# Patient Record
Sex: Female | Born: 1955
Health system: Southern US, Community
[De-identification: ages and names within clinical notes are randomized; demographics above are authoritative.]

## PROBLEM LIST (undated history)

## (undated) DIAGNOSIS — J189 Pneumonia, unspecified organism: Secondary | ICD-10-CM

## (undated) DIAGNOSIS — M797 Fibromyalgia: Secondary | ICD-10-CM

## (undated) DIAGNOSIS — T402X5A Adverse effect of other opioids, initial encounter: Secondary | ICD-10-CM

## (undated) DIAGNOSIS — K5903 Drug induced constipation: Secondary | ICD-10-CM

## (undated) DIAGNOSIS — J45909 Unspecified asthma, uncomplicated: Secondary | ICD-10-CM

## (undated) DIAGNOSIS — M1990 Unspecified osteoarthritis, unspecified site: Secondary | ICD-10-CM

## (undated) HISTORY — PX: BACK SURGERY: SHX140

## (undated) HISTORY — PX: CARPAL TUNNEL RELEASE: SHX101

## (undated) HISTORY — PX: COLONOSCOPY: SHX174

## (undated) HISTORY — PX: SPINAL CORD STIMULATOR IMPLANT: SHX2422

---

## 1984-07-20 HISTORY — PX: CARPAL TUNNEL RELEASE: SHX101

## 1984-07-20 HISTORY — PX: TUBAL LIGATION: SHX77

## 1998-04-10 ENCOUNTER — Other Ambulatory Visit: Admission: RE | Admit: 1998-04-10 | Discharge: 1998-04-10 | Payer: Self-pay | Admitting: Obstetrics & Gynecology

## 1999-05-07 ENCOUNTER — Other Ambulatory Visit: Admission: RE | Admit: 1999-05-07 | Discharge: 1999-05-07 | Payer: Self-pay | Admitting: Obstetrics & Gynecology

## 2000-06-04 ENCOUNTER — Other Ambulatory Visit: Admission: RE | Admit: 2000-06-04 | Discharge: 2000-06-04 | Payer: Self-pay | Admitting: Obstetrics & Gynecology

## 2001-10-03 ENCOUNTER — Other Ambulatory Visit: Admission: RE | Admit: 2001-10-03 | Discharge: 2001-10-03 | Payer: Self-pay | Admitting: Obstetrics & Gynecology

## 2002-10-26 ENCOUNTER — Other Ambulatory Visit: Admission: RE | Admit: 2002-10-26 | Discharge: 2002-10-26 | Payer: Self-pay | Admitting: Obstetrics & Gynecology

## 2003-12-12 ENCOUNTER — Other Ambulatory Visit: Admission: RE | Admit: 2003-12-12 | Discharge: 2003-12-12 | Payer: Self-pay | Admitting: Obstetrics & Gynecology

## 2009-08-16 ENCOUNTER — Ambulatory Visit: Payer: Self-pay | Admitting: Diagnostic Radiology

## 2009-08-16 ENCOUNTER — Ambulatory Visit (HOSPITAL_BASED_OUTPATIENT_CLINIC_OR_DEPARTMENT_OTHER): Admission: RE | Admit: 2009-08-16 | Discharge: 2009-08-16 | Payer: Self-pay | Admitting: Family Medicine

## 2010-09-19 ENCOUNTER — Other Ambulatory Visit (HOSPITAL_BASED_OUTPATIENT_CLINIC_OR_DEPARTMENT_OTHER): Payer: Self-pay | Admitting: *Deleted

## 2010-09-19 ENCOUNTER — Other Ambulatory Visit (HOSPITAL_BASED_OUTPATIENT_CLINIC_OR_DEPARTMENT_OTHER): Payer: Self-pay | Admitting: Family Medicine

## 2010-09-19 DIAGNOSIS — Z139 Encounter for screening, unspecified: Secondary | ICD-10-CM

## 2010-09-22 ENCOUNTER — Ambulatory Visit (HOSPITAL_BASED_OUTPATIENT_CLINIC_OR_DEPARTMENT_OTHER): Payer: Private Health Insurance - Indemnity

## 2010-10-01 ENCOUNTER — Ambulatory Visit (HOSPITAL_BASED_OUTPATIENT_CLINIC_OR_DEPARTMENT_OTHER): Payer: Self-pay

## 2010-10-06 ENCOUNTER — Ambulatory Visit (HOSPITAL_BASED_OUTPATIENT_CLINIC_OR_DEPARTMENT_OTHER): Payer: Self-pay

## 2010-10-09 ENCOUNTER — Ambulatory Visit (HOSPITAL_BASED_OUTPATIENT_CLINIC_OR_DEPARTMENT_OTHER)
Admission: RE | Admit: 2010-10-09 | Discharge: 2010-10-09 | Disposition: A | Discharge status: Home / Self Care | Payer: Private Health Insurance - Indemnity | Source: Ambulatory Visit | Attending: Family Medicine | Admitting: Family Medicine

## 2010-10-09 DIAGNOSIS — Z139 Encounter for screening, unspecified: Secondary | ICD-10-CM

## 2010-10-09 DIAGNOSIS — Z1231 Encounter for screening mammogram for malignant neoplasm of breast: Secondary | ICD-10-CM | POA: Insufficient documentation

## 2011-10-05 ENCOUNTER — Other Ambulatory Visit (HOSPITAL_BASED_OUTPATIENT_CLINIC_OR_DEPARTMENT_OTHER): Payer: Self-pay | Admitting: Family Medicine

## 2011-10-05 DIAGNOSIS — Z1231 Encounter for screening mammogram for malignant neoplasm of breast: Secondary | ICD-10-CM

## 2011-10-12 ENCOUNTER — Ambulatory Visit (HOSPITAL_BASED_OUTPATIENT_CLINIC_OR_DEPARTMENT_OTHER)
Admission: RE | Admit: 2011-10-12 | Discharge: 2011-10-12 | Disposition: A | Discharge status: Home / Self Care | Payer: Private Health Insurance - Indemnity | Source: Ambulatory Visit | Attending: Family Medicine | Admitting: Family Medicine

## 2011-10-12 DIAGNOSIS — Z1231 Encounter for screening mammogram for malignant neoplasm of breast: Secondary | ICD-10-CM

## 2012-07-20 HISTORY — PX: BUNIONECTOMY: SHX129

## 2012-11-23 ENCOUNTER — Other Ambulatory Visit (HOSPITAL_BASED_OUTPATIENT_CLINIC_OR_DEPARTMENT_OTHER): Payer: Self-pay | Admitting: Family Medicine

## 2012-11-23 DIAGNOSIS — Z1231 Encounter for screening mammogram for malignant neoplasm of breast: Secondary | ICD-10-CM

## 2012-11-28 ENCOUNTER — Ambulatory Visit (HOSPITAL_BASED_OUTPATIENT_CLINIC_OR_DEPARTMENT_OTHER): Payer: Private Health Insurance - Indemnity

## 2012-11-28 ENCOUNTER — Ambulatory Visit (HOSPITAL_BASED_OUTPATIENT_CLINIC_OR_DEPARTMENT_OTHER)
Admission: RE | Admit: 2012-11-28 | Discharge: 2012-11-28 | Disposition: A | Discharge status: Home / Self Care | Payer: Private Health Insurance - Indemnity | Source: Ambulatory Visit | Attending: Family Medicine | Admitting: Family Medicine

## 2012-11-28 DIAGNOSIS — Z1231 Encounter for screening mammogram for malignant neoplasm of breast: Secondary | ICD-10-CM | POA: Insufficient documentation

## 2013-03-28 ENCOUNTER — Other Ambulatory Visit (HOSPITAL_BASED_OUTPATIENT_CLINIC_OR_DEPARTMENT_OTHER): Payer: Self-pay | Admitting: Physical Medicine and Rehabilitation

## 2013-03-28 DIAGNOSIS — IMO0002 Reserved for concepts with insufficient information to code with codable children: Secondary | ICD-10-CM

## 2013-04-01 ENCOUNTER — Ambulatory Visit (HOSPITAL_BASED_OUTPATIENT_CLINIC_OR_DEPARTMENT_OTHER): Payer: Managed Care, Other (non HMO)

## 2013-04-01 ENCOUNTER — Ambulatory Visit (HOSPITAL_BASED_OUTPATIENT_CLINIC_OR_DEPARTMENT_OTHER)
Admission: RE | Admit: 2013-04-01 | Discharge: 2013-04-01 | Disposition: A | Discharge status: Home / Self Care | Payer: Managed Care, Other (non HMO) | Source: Ambulatory Visit | Attending: Physical Medicine and Rehabilitation | Admitting: Physical Medicine and Rehabilitation

## 2013-04-01 DIAGNOSIS — IMO0002 Reserved for concepts with insufficient information to code with codable children: Secondary | ICD-10-CM

## 2013-04-01 DIAGNOSIS — M5126 Other intervertebral disc displacement, lumbar region: Secondary | ICD-10-CM | POA: Insufficient documentation

## 2013-04-01 DIAGNOSIS — Q762 Congenital spondylolisthesis: Secondary | ICD-10-CM | POA: Insufficient documentation

## 2013-04-01 DIAGNOSIS — G8929 Other chronic pain: Secondary | ICD-10-CM | POA: Insufficient documentation

## 2014-01-15 ENCOUNTER — Other Ambulatory Visit (HOSPITAL_BASED_OUTPATIENT_CLINIC_OR_DEPARTMENT_OTHER): Payer: Self-pay | Admitting: Family Medicine

## 2014-01-15 DIAGNOSIS — Z1231 Encounter for screening mammogram for malignant neoplasm of breast: Secondary | ICD-10-CM

## 2014-01-17 ENCOUNTER — Ambulatory Visit (HOSPITAL_BASED_OUTPATIENT_CLINIC_OR_DEPARTMENT_OTHER)
Admission: RE | Admit: 2014-01-17 | Discharge: 2014-01-17 | Disposition: A | Discharge status: Home / Self Care | Payer: Managed Care, Other (non HMO) | Source: Ambulatory Visit | Attending: Family Medicine | Admitting: Family Medicine

## 2014-01-17 DIAGNOSIS — Z1231 Encounter for screening mammogram for malignant neoplasm of breast: Secondary | ICD-10-CM | POA: Insufficient documentation

## 2014-05-31 ENCOUNTER — Other Ambulatory Visit: Payer: Self-pay | Admitting: Neurological Surgery

## 2014-06-27 ENCOUNTER — Encounter (HOSPITAL_COMMUNITY): Payer: Self-pay

## 2014-06-27 ENCOUNTER — Encounter (HOSPITAL_COMMUNITY)
Admission: RE | Admit: 2014-06-27 | Discharge: 2014-06-27 | Disposition: A | Discharge status: Home / Self Care | Payer: Managed Care, Other (non HMO) | Source: Ambulatory Visit | Attending: Neurological Surgery | Admitting: Neurological Surgery

## 2014-06-27 HISTORY — DX: Unspecified osteoarthritis, unspecified site: M19.90

## 2014-06-27 HISTORY — DX: Pneumonia, unspecified organism: J18.9

## 2014-06-27 HISTORY — DX: Fibromyalgia: M79.7

## 2014-06-27 LAB — CBC
HCT: 40 % (ref 36.0–46.0)
Hemoglobin: 13.2 g/dL (ref 12.0–15.0)
MCH: 30.8 pg (ref 26.0–34.0)
MCHC: 33 g/dL (ref 30.0–36.0)
MCV: 93.2 fL (ref 78.0–100.0)
PLATELETS: 178 10*3/uL (ref 150–400)
RBC: 4.29 MIL/uL (ref 3.87–5.11)
RDW: 13.5 % (ref 11.5–15.5)
WBC: 5.3 10*3/uL (ref 4.0–10.5)

## 2014-06-27 LAB — BASIC METABOLIC PANEL
Anion gap: 11 (ref 5–15)
BUN: 17 mg/dL (ref 6–23)
CALCIUM: 9.7 mg/dL (ref 8.4–10.5)
CO2: 28 mEq/L (ref 19–32)
CREATININE: 0.86 mg/dL (ref 0.50–1.10)
Chloride: 101 mEq/L (ref 96–112)
GFR calc Af Amer: 83 mL/min — ABNORMAL LOW (ref >90)
GFR, EST NON AFRICAN AMERICAN: 72 mL/min — AB (ref >90)
Glucose, Bld: 92 mg/dL (ref 70–99)
Potassium: 4.6 mEq/L (ref 3.7–5.3)
Sodium: 140 mEq/L (ref 137–147)

## 2014-06-27 LAB — SURGICAL PCR SCREEN
MRSA, PCR: NEGATIVE
STAPHYLOCOCCUS AUREUS: POSITIVE — AB

## 2014-06-27 LAB — ABO/RH: ABO/RH(D): O NEG

## 2014-06-27 LAB — TYPE AND SCREEN
ABO/RH(D): O NEG
Antibody Screen: NEGATIVE

## 2014-06-27 NOTE — Pre-Procedure Instructions (Signed)
Angela Cook  06/27/2014   Your procedure is scheduled on:  Friday July 06, 2014 1109 AM  [facility] at 0800 AM.  Call this number if you have problems the morning of surgery: [phone]   Remember:   Do not eat food or drink liquids after midnight.   Take these medicines the morning of surgery with A SIP OF WATER: Celexa and Ultram    Do not wear jewelry, make-up or nail polish.  Do not wear lotions, powders, or perfumes. You may wear deodorant.  Do not shave 48 hours prior to surgery.   Do not bring valuables to the hospital.  [facility] is not responsible                  for any belongings or valuables.               Contacts, dentures or bridgework may not be worn into surgery.  Leave suitcase in the car. After surgery it may be brought to your room.  For patients admitted to the hospital, discharge time is determined by your                treatment team.               Patients discharged the day of surgery will not be allowed to drive  home.   Special Instructions: [place] - Preparing for Surgery  Before surgery, you can play an important role.  Because skin is not sterile, your skin needs to be as free of germs as possible.  You can reduce the number of germs on you skin by washing with CHG (chlorahexidine gluconate) soap before surgery.  CHG is an antiseptic cleaner which kills germs and bonds with the skin to continue killing germs even after washing.  Please DO NOT use if you have an allergy to CHG or antibacterial soaps.  If your skin becomes reddened/irritated stop using the CHG and inform your nurse when you arrive at Short Stay.  Do not shave (including legs and underarms) for at least 48 hours prior to the first CHG shower.  You may shave your face.  Please follow these instructions carefully:   1.  Shower with CHG Soap the night before surgery and the                                morning of Surgery.  2.  If you choose to  wash your hair, wash your hair first as usual with your       normal shampoo.  3.  After you shampoo, rinse your hair and body thoroughly to remove the                      Shampoo.  4.  Use CHG as you would any other liquid soap.  You can apply chg directly       to the skin and wash gently with scrungie or a clean washcloth.  5.  Apply the CHG Soap to your body ONLY FROM THE NECK DOWN.        Do not use on open wounds or open sores.  Avoid contact with your eyes,       ears, mouth and genitals (private parts).  Wash genitals (private parts)       with your normal soap.  6.  Wash thoroughly,  paying special attention to the area where your surgery        will be performed.  7.  Thoroughly rinse your body with warm water from the neck down.  8.  DO NOT shower/wash with your normal soap after using and rinsing off       the CHG Soap.  9.  Pat yourself dry with a clean towel.            10.  Wear clean pajamas.            11.  Place clean sheets on your bed the night of your first shower and do not        sleep with pets.  Day of Surgery  Do not apply any lotions/deoderants the morning of surgery.  Please wear clean clothes to the hospital/surgery center.     Please read over the following fact sheets that you were given: Pain Booklet, Coughing and Deep Breathing, Blood Transfusion Information, MRSA Information and Surgical Site Infection Prevention

## 2014-06-28 NOTE — Progress Notes (Signed)
[facility]up[facility]cin [facility]ment [facility] [address] for positive PCR of staph. Left message on pharmacy voicemail.

## 2014-07-05 MED ORDER — CEFAZOLIN SODIUM-DEXTROSE 2-3 GM-% IV SOLR
2.0000 g | INTRAVENOUS | Status: AC
  Administered 2014-07-06: 2 g via INTRAVENOUS
  Filled 2014-07-05: qty 50

## 2014-07-06 ENCOUNTER — Inpatient Hospital Stay (HOSPITAL_COMMUNITY): Payer: Managed Care, Other (non HMO)

## 2014-07-06 ENCOUNTER — Inpatient Hospital Stay (HOSPITAL_COMMUNITY): Payer: Managed Care, Other (non HMO) | Admitting: Anesthesiology

## 2014-07-06 ENCOUNTER — Encounter (HOSPITAL_COMMUNITY): Payer: Self-pay | Admitting: *Deleted

## 2014-07-06 ENCOUNTER — Inpatient Hospital Stay (HOSPITAL_COMMUNITY)
Admission: RE | Admit: 2014-07-06 | Discharge: 2014-07-10 | DRG: 460 | Disposition: A | Discharge status: Home / Self Care | Payer: Managed Care, Other (non HMO) | Source: Ambulatory Visit | Attending: Neurological Surgery | Admitting: Neurological Surgery

## 2014-07-06 ENCOUNTER — Encounter (HOSPITAL_COMMUNITY)
Admission: RE | Disposition: A | Discharge status: Home / Self Care | Payer: Self-pay | Source: Ambulatory Visit | Attending: Neurological Surgery

## 2014-07-06 DIAGNOSIS — M549 Dorsalgia, unspecified: Secondary | ICD-10-CM | POA: Diagnosis present

## 2014-07-06 DIAGNOSIS — Y838 Other surgical procedures as the cause of abnormal reaction of the patient, or of later complication, without mention of misadventure at the time of the procedure: Secondary | ICD-10-CM | POA: Diagnosis present

## 2014-07-06 DIAGNOSIS — Z419 Encounter for procedure for purposes other than remedying health state, unspecified: Secondary | ICD-10-CM

## 2014-07-06 DIAGNOSIS — T84226A Displacement of internal fixation device of vertebrae, initial encounter: Secondary | ICD-10-CM | POA: Diagnosis present

## 2014-07-06 DIAGNOSIS — M960 Pseudarthrosis after fusion or arthrodesis: Secondary | ICD-10-CM | POA: Diagnosis present

## 2014-07-06 DIAGNOSIS — M5416 Radiculopathy, lumbar region: Secondary | ICD-10-CM | POA: Diagnosis present

## 2014-07-06 DIAGNOSIS — M4806 Spinal stenosis, lumbar region: Secondary | ICD-10-CM | POA: Diagnosis present

## 2014-07-06 DIAGNOSIS — S32009K Unspecified fracture of unspecified lumbar vertebra, subsequent encounter for fracture with nonunion: Secondary | ICD-10-CM | POA: Diagnosis present

## 2014-07-06 SURGERY — POSTERIOR LUMBAR FUSION 1 LEVEL
Anesthesia: General | Site: Back

## 2014-07-06 MED ORDER — MIDAZOLAM HCL 2 MG/2ML IJ SOLN
INTRAMUSCULAR | Status: AC
  Filled 2014-07-06: qty 2

## 2014-07-06 MED ORDER — SUCCINYLCHOLINE CHLORIDE 20 MG/ML IJ SOLN
INTRAMUSCULAR | Status: AC
  Filled 2014-07-06: qty 1

## 2014-07-06 MED ORDER — FENTANYL CITRATE 0.05 MG/ML IJ SOLN
INTRAMUSCULAR | Status: AC
  Filled 2014-07-06: qty 2

## 2014-07-06 MED ORDER — ACETAMINOPHEN 650 MG RE SUPP: 650.0000 mg | RECTAL | Status: DC | PRN

## 2014-07-06 MED ORDER — MORPHINE SULFATE 2 MG/ML IJ SOLN: 1.0000 mg | INTRAMUSCULAR | Status: DC | PRN

## 2014-07-06 MED ORDER — FENTANYL CITRATE 0.05 MG/ML IJ SOLN
INTRAMUSCULAR | Status: AC
  Filled 2014-07-06: qty 5

## 2014-07-06 MED ORDER — ARTIFICIAL TEARS OP OINT
TOPICAL_OINTMENT | OPHTHALMIC | Status: AC
  Filled 2014-07-06: qty 3.5

## 2014-07-06 MED ORDER — ONDANSETRON HCL 4 MG/2ML IJ SOLN: 4.0000 mg | INTRAMUSCULAR | Status: DC | PRN

## 2014-07-06 MED ORDER — METHOCARBAMOL 1000 MG/10ML IJ SOLN
500.0000 mg | Freq: Four times a day (QID) | INTRAMUSCULAR | Status: DC | PRN
  Filled 2014-07-06: qty 5

## 2014-07-06 MED ORDER — SODIUM CHLORIDE 0.9 % IJ SOLN
3.0000 mL | Freq: Two times a day (BID) | INTRAMUSCULAR | Status: DC
  Administered 2014-07-06: 3 mL via INTRAVENOUS

## 2014-07-06 MED ORDER — BISACODYL 10 MG RE SUPP: 10.0000 mg | Freq: Every day | RECTAL | Status: DC | PRN

## 2014-07-06 MED ORDER — HYDROMORPHONE HCL 1 MG/ML IJ SOLN
INTRAMUSCULAR | Status: AC
Start: 2014-07-06 — End: 2014-07-06
  Administered 2014-07-06: 0.5 mg via INTRAVENOUS
  Filled 2014-07-06: qty 1

## 2014-07-06 MED ORDER — FENTANYL CITRATE 0.05 MG/ML IJ SOLN
25.0000 ug | INTRAMUSCULAR | Status: DC | PRN
  Administered 2014-07-06 (×2): 50 ug via INTRAVENOUS

## 2014-07-06 MED ORDER — HYDROMORPHONE HCL 1 MG/ML IJ SOLN
0.5000 mg | INTRAMUSCULAR | Status: DC | PRN
  Administered 2014-07-06 (×3): 0.5 mg via INTRAVENOUS

## 2014-07-06 MED ORDER — POLYETHYLENE GLYCOL 3350 17 G PO PACK: 17.0000 g | PACK | Freq: Every day | ORAL | Status: DC | PRN

## 2014-07-06 MED ORDER — LIDOCAINE-EPINEPHRINE 1 %-1:100000 IJ SOLN
INTRAMUSCULAR | Status: DC | PRN
  Administered 2014-07-06: 5 mL

## 2014-07-06 MED ORDER — DEXAMETHASONE SODIUM PHOSPHATE 10 MG/ML IJ SOLN
INTRAMUSCULAR | Status: AC
  Filled 2014-07-06: qty 1

## 2014-07-06 MED ORDER — HYDROCODONE-ACETAMINOPHEN 5-325 MG PO TABS
1.0000 | ORAL_TABLET | ORAL | Status: DC | PRN
  Administered 2014-07-06 – 2014-07-09 (×11): 2 via ORAL
  Filled 2014-07-06 (×11): qty 2

## 2014-07-06 MED ORDER — MENTHOL 3 MG MT LOZG
1.0000 | LOZENGE | OROMUCOSAL | Status: DC | PRN
  Filled 2014-07-06: qty 9

## 2014-07-06 MED ORDER — FENTANYL CITRATE 0.05 MG/ML IJ SOLN
INTRAMUSCULAR | Status: DC | PRN
  Administered 2014-07-06 (×3): 50 ug via INTRAVENOUS
  Administered 2014-07-06: 100 ug via INTRAVENOUS

## 2014-07-06 MED ORDER — SENNA 8.6 MG PO TABS
1.0000 | ORAL_TABLET | Freq: Two times a day (BID) | ORAL | Status: DC
  Administered 2014-07-06 – 2014-07-10 (×8): 8.6 mg via ORAL
  Filled 2014-07-06 (×8): qty 1

## 2014-07-06 MED ORDER — CITALOPRAM HYDROBROMIDE 40 MG PO TABS
40.0000 mg | ORAL_TABLET | Freq: Every day | ORAL | Status: DC
  Administered 2014-07-07 – 2014-07-10 (×4): 40 mg via ORAL
  Filled 2014-07-06 (×4): qty 1

## 2014-07-06 MED ORDER — OXYCODONE-ACETAMINOPHEN 5-325 MG PO TABS
1.0000 | ORAL_TABLET | ORAL | Status: DC | PRN
  Administered 2014-07-09 (×4): 2 via ORAL
  Administered 2014-07-10: 1 via ORAL
  Administered 2014-07-10: 2 via ORAL
  Filled 2014-07-06 (×4): qty 2
  Filled 2014-07-06: qty 1
  Filled 2014-07-06 (×2): qty 2

## 2014-07-06 MED ORDER — LIDOCAINE HCL (CARDIAC) 20 MG/ML IV SOLN
INTRAVENOUS | Status: AC
  Filled 2014-07-06: qty 5

## 2014-07-06 MED ORDER — ROCURONIUM BROMIDE 50 MG/5ML IV SOLN
INTRAVENOUS | Status: AC
  Filled 2014-07-06: qty 4

## 2014-07-06 MED ORDER — NEOSTIGMINE METHYLSULFATE 10 MG/10ML IV SOLN
INTRAVENOUS | Status: AC
  Filled 2014-07-06: qty 1

## 2014-07-06 MED ORDER — ACETAMINOPHEN 325 MG PO TABS: 650.0000 mg | ORAL_TABLET | ORAL | Status: DC | PRN

## 2014-07-06 MED ORDER — LIDOCAINE HCL (CARDIAC) 20 MG/ML IV SOLN
INTRAVENOUS | Status: DC | PRN
  Administered 2014-07-06: 100 mg via INTRAVENOUS

## 2014-07-06 MED ORDER — DOCUSATE SODIUM 100 MG PO CAPS
100.0000 mg | ORAL_CAPSULE | Freq: Two times a day (BID) | ORAL | Status: DC
  Administered 2014-07-06 – 2014-07-10 (×8): 100 mg via ORAL
  Filled 2014-07-06 (×8): qty 1

## 2014-07-06 MED ORDER — FENTANYL CITRATE 0.05 MG/ML IJ SOLN
INTRAMUSCULAR | Status: AC
  Administered 2014-07-06: 50 ug via INTRAVENOUS
  Filled 2014-07-06: qty 2

## 2014-07-06 MED ORDER — PROPOFOL 10 MG/ML IV BOLUS
INTRAVENOUS | Status: AC
  Filled 2014-07-06: qty 20

## 2014-07-06 MED ORDER — EPHEDRINE SULFATE 50 MG/ML IJ SOLN
INTRAMUSCULAR | Status: DC | PRN
  Administered 2014-07-06: 5 mg via INTRAVENOUS
  Administered 2014-07-06: 10 mg via INTRAVENOUS

## 2014-07-06 MED ORDER — THROMBIN 20000 UNITS EX SOLR
CUTANEOUS | Status: DC | PRN
  Administered 2014-07-06: 16:00:00 via TOPICAL

## 2014-07-06 MED ORDER — SODIUM CHLORIDE 0.9 % IJ SOLN
3.0000 mL | INTRAMUSCULAR | Status: DC | PRN
  Administered 2014-07-06: 3 mL via INTRAVENOUS
  Filled 2014-07-06: qty 3

## 2014-07-06 MED ORDER — GLYCOPYRROLATE 0.2 MG/ML IJ SOLN
INTRAMUSCULAR | Status: DC | PRN
  Administered 2014-07-06: .5 mg via INTRAVENOUS

## 2014-07-06 MED ORDER — PHENOL 1.4 % MT LIQD: 1.0000 | OROMUCOSAL | Status: DC | PRN

## 2014-07-06 MED ORDER — PROPOFOL 10 MG/ML IV BOLUS
INTRAVENOUS | Status: DC | PRN
  Administered 2014-07-06: 130 mg via INTRAVENOUS

## 2014-07-06 MED ORDER — SODIUM CHLORIDE 0.9 % IV SOLN: 250.0000 mL | INTRAVENOUS | Status: DC

## 2014-07-06 MED ORDER — ONDANSETRON HCL 4 MG/2ML IJ SOLN
INTRAMUSCULAR | Status: AC
  Filled 2014-07-06: qty 2

## 2014-07-06 MED ORDER — HYDROMORPHONE HCL 1 MG/ML IJ SOLN
INTRAMUSCULAR | Status: AC
  Filled 2014-07-06: qty 1

## 2014-07-06 MED ORDER — ARTIFICIAL TEARS OP OINT
TOPICAL_OINTMENT | OPHTHALMIC | Status: AC
Start: 2014-07-06 — End: 2014-07-06
  Filled 2014-07-06: qty 3.5

## 2014-07-06 MED ORDER — TRAZODONE HCL 50 MG PO TABS
50.0000 mg | ORAL_TABLET | Freq: Every day | ORAL | Status: DC
  Administered 2014-07-06 – 2014-07-09 (×4): 50 mg via ORAL
  Filled 2014-07-06 (×4): qty 1

## 2014-07-06 MED ORDER — SODIUM CHLORIDE 0.9 % IV SOLN
INTRAVENOUS | Status: DC
  Administered 2014-07-06: 21:00:00 via INTRAVENOUS

## 2014-07-06 MED ORDER — ALBUMIN HUMAN 5 % IV SOLN
INTRAVENOUS | Status: DC | PRN
  Administered 2014-07-06: 18:00:00 via INTRAVENOUS

## 2014-07-06 MED ORDER — DEXAMETHASONE SODIUM PHOSPHATE 10 MG/ML IJ SOLN
INTRAMUSCULAR | Status: DC | PRN
  Administered 2014-07-06: 10 mg via INTRAVENOUS

## 2014-07-06 MED ORDER — MEPERIDINE HCL 25 MG/ML IJ SOLN: 6.2500 mg | INTRAMUSCULAR | Status: DC | PRN

## 2014-07-06 MED ORDER — ONDANSETRON HCL 4 MG/2ML IJ SOLN
INTRAMUSCULAR | Status: DC | PRN
  Administered 2014-07-06: 4 mg via INTRAVENOUS

## 2014-07-06 MED ORDER — METHOCARBAMOL 500 MG PO TABS
500.0000 mg | ORAL_TABLET | Freq: Four times a day (QID) | ORAL | Status: DC | PRN
  Administered 2014-07-08: 500 mg via ORAL
  Filled 2014-07-06: qty 1

## 2014-07-06 MED ORDER — BACITRACIN 50000 UNITS IM SOLR
INTRAMUSCULAR | Status: DC | PRN
  Administered 2014-07-06: 16:00:00

## 2014-07-06 MED ORDER — MIDAZOLAM HCL 5 MG/5ML IJ SOLN
INTRAMUSCULAR | Status: DC | PRN
  Administered 2014-07-06: 2 mg via INTRAVENOUS

## 2014-07-06 MED ORDER — ROCURONIUM BROMIDE 100 MG/10ML IV SOLN
INTRAVENOUS | Status: DC | PRN
  Administered 2014-07-06: 40 mg via INTRAVENOUS
  Administered 2014-07-06: 10 mg via INTRAVENOUS

## 2014-07-06 MED ORDER — ALUM & MAG HYDROXIDE-SIMETH 200-200-20 MG/5ML PO SUSP: 30.0000 mL | Freq: Four times a day (QID) | ORAL | Status: DC | PRN

## 2014-07-06 MED ORDER — VECURONIUM BROMIDE 10 MG IV SOLR
INTRAVENOUS | Status: AC
  Filled 2014-07-06: qty 10

## 2014-07-06 MED ORDER — NEOSTIGMINE METHYLSULFATE 10 MG/10ML IV SOLN
INTRAVENOUS | Status: DC | PRN
  Administered 2014-07-06: 3.5 mg via INTRAVENOUS

## 2014-07-06 MED ORDER — VECURONIUM BROMIDE 10 MG IV SOLR
INTRAVENOUS | Status: DC | PRN
  Administered 2014-07-06: 3 mg via INTRAVENOUS

## 2014-07-06 MED ORDER — CEFAZOLIN SODIUM 1-5 GM-% IV SOLN
1.0000 g | Freq: Three times a day (TID) | INTRAVENOUS | Status: AC
  Administered 2014-07-06 – 2014-07-07 (×2): 1 g via INTRAVENOUS
  Filled 2014-07-06 (×3): qty 50

## 2014-07-06 MED ORDER — LACTATED RINGERS IV SOLN
INTRAVENOUS | Status: DC
  Administered 2014-07-06: 09:00:00 via INTRAVENOUS

## 2014-07-06 MED ORDER — BUPIVACAINE HCL (PF) 0.5 % IJ SOLN
INTRAMUSCULAR | Status: DC | PRN
  Administered 2014-07-06: 5 mL

## 2014-07-06 MED ORDER — 0.9 % SODIUM CHLORIDE (POUR BTL) OPTIME
TOPICAL | Status: DC | PRN
  Administered 2014-07-06: 1000 mL

## 2014-07-06 MED ORDER — LACTATED RINGERS IV SOLN
INTRAVENOUS | Status: DC | PRN
  Administered 2014-07-06 (×3): via INTRAVENOUS

## 2014-07-06 MED ORDER — PROMETHAZINE HCL 25 MG/ML IJ SOLN: 6.2500 mg | INTRAMUSCULAR | Status: DC | PRN

## 2014-07-06 SURGICAL SUPPLY — 58 items
BAG DECANTER FOR FLEXI CONT (MISCELLANEOUS) ×2 IMPLANT
BLADE CLIPPER SURG (BLADE) IMPLANT
BONE CANC CHIPS 20CC PCAN1/4 (Bone Implant) ×2 IMPLANT
BUR MATCHSTICK NEURO 3.0 LAGG (BURR) ×2 IMPLANT
CAGE COROENT PLIF 10X28-8 LUMB (Cage) ×4 IMPLANT
CANISTER SUCT 3000ML (MISCELLANEOUS) ×2 IMPLANT
CHIPS CANC BONE 20CC PCAN1/4 (Bone Implant) ×1 IMPLANT
CONT SPEC 4OZ CLIKSEAL STRL BL (MISCELLANEOUS) ×4 IMPLANT
COVER BACK TABLE 60X90IN (DRAPES) ×2 IMPLANT
DECANTER SPIKE VIAL GLASS SM (MISCELLANEOUS) ×2 IMPLANT
DRAPE C-ARM 42X72 X-RAY (DRAPES) ×4 IMPLANT
DRAPE LAPAROTOMY 100X72X124 (DRAPES) ×2 IMPLANT
DRAPE POUCH INSTRU U-SHP 10X18 (DRAPES) ×2 IMPLANT
DRAPE PROXIMA HALF (DRAPES) IMPLANT
DRSG OPSITE POSTOP 4X6 (GAUZE/BANDAGES/DRESSINGS) ×2 IMPLANT
DURAPREP 26ML APPLICATOR (WOUND CARE) ×2 IMPLANT
ELECT REM PT RETURN 9FT ADLT (ELECTROSURGICAL) ×2
ELECTRODE REM PT RTRN 9FT ADLT (ELECTROSURGICAL) ×1 IMPLANT
GAUZE SPONGE 4X4 12PLY STRL (GAUZE/BANDAGES/DRESSINGS) ×2 IMPLANT
GAUZE SPONGE 4X4 16PLY XRAY LF (GAUZE/BANDAGES/DRESSINGS) IMPLANT
GLOVE BIO SURGEON STRL SZ 6.5 (GLOVE) ×6 IMPLANT
GLOVE BIOGEL M 8.0 STRL (GLOVE) ×2 IMPLANT
GLOVE BIOGEL PI IND STRL 8.5 (GLOVE) ×2 IMPLANT
GLOVE BIOGEL PI INDICATOR 8.5 (GLOVE) ×2
GLOVE ECLIPSE 8.5 STRL (GLOVE) ×4 IMPLANT
GLOVE EXAM NITRILE LRG STRL (GLOVE) IMPLANT
GLOVE EXAM NITRILE MD LF STRL (GLOVE) IMPLANT
GLOVE EXAM NITRILE XL STR (GLOVE) IMPLANT
GLOVE EXAM NITRILE XS STR PU (GLOVE) IMPLANT
GOWN STRL REUS W/ TWL LRG LVL3 (GOWN DISPOSABLE) ×1 IMPLANT
GOWN STRL REUS W/ TWL XL LVL3 (GOWN DISPOSABLE) IMPLANT
GOWN STRL REUS W/TWL 2XL LVL3 (GOWN DISPOSABLE) ×4 IMPLANT
GOWN STRL REUS W/TWL LRG LVL3 (GOWN DISPOSABLE) ×1
GOWN STRL REUS W/TWL XL LVL3 (GOWN DISPOSABLE)
HEMOSTAT POWDER KIT SURGIFOAM (HEMOSTASIS) IMPLANT
KIT BASIN OR (CUSTOM PROCEDURE TRAY) ×2 IMPLANT
KIT INFUSE X SMALL 1.4CC (Orthopedic Implant) ×2 IMPLANT
KIT ROOM TURNOVER OR (KITS) ×2 IMPLANT
LIQUID BAND (GAUZE/BANDAGES/DRESSINGS) ×2 IMPLANT
MILL MEDIUM DISP (BLADE) ×2 IMPLANT
NEEDLE HYPO 22GX1.5 SAFETY (NEEDLE) ×2 IMPLANT
NS IRRIG 1000ML POUR BTL (IV SOLUTION) ×2 IMPLANT
PACK LAMINECTOMY NEURO (CUSTOM PROCEDURE TRAY) ×2 IMPLANT
PAD ARMBOARD 7.5X6 YLW CONV (MISCELLANEOUS) ×6 IMPLANT
PATTIES SURGICAL .5 X1 (DISPOSABLE) ×2 IMPLANT
SPONGE LAP 4X18 X RAY DECT (DISPOSABLE) IMPLANT
SPONGE SURGIFOAM ABS GEL 100 (HEMOSTASIS) ×2 IMPLANT
SUT VIC AB 1 CT1 18XBRD ANBCTR (SUTURE) ×1 IMPLANT
SUT VIC AB 1 CT1 8-18 (SUTURE) ×1
SUT VIC AB 2-0 CP2 18 (SUTURE) ×2 IMPLANT
SUT VIC AB 3-0 SH 8-18 (SUTURE) ×2 IMPLANT
SYR 20ML ECCENTRIC (SYRINGE) ×2 IMPLANT
SYR 3ML LL SCALE MARK (SYRINGE) ×8 IMPLANT
TOWEL OR 17X24 6PK STRL BLUE (TOWEL DISPOSABLE) ×2 IMPLANT
TOWEL OR 17X26 10 PK STRL BLUE (TOWEL DISPOSABLE) ×2 IMPLANT
TRAP SPECIMEN MUCOUS 40CC (MISCELLANEOUS) ×2 IMPLANT
TRAY FOLEY CATH 14FRSI W/METER (CATHETERS) ×2 IMPLANT
WATER STERILE IRR 1000ML POUR (IV SOLUTION) ×2 IMPLANT

## 2014-07-06 NOTE — Progress Notes (Signed)
[facility] and [facility]. Oriented patient to room and equipment. Dsg to back CDI at this time.

## 2014-07-06 NOTE — Anesthesia Procedure Notes (Signed)
Procedure Name: Intubation Date/Time: 07/06/2014 3:56 PM Performed by: Margaree MackintoshYACOUB, Kellon Chalk B Pre-anesthesia Checklist: Patient identified, Emergency Drugs available, Suction available, Patient being monitored and Timeout performed Patient Re-evaluated:Patient Re-evaluated prior to inductionOxygen Delivery Method: Circle system utilized Preoxygenation: Pre-oxygenation with 100% oxygen Intubation Type: IV induction Ventilation: Mask ventilation without difficulty Laryngoscope Size: Mac and 3 Grade View: Grade I Tube type: Oral Tube size: 7.0 mm Number of attempts: 1 Airway Equipment and Method: Stylet Placement Confirmation: ETT inserted through vocal cords under direct vision,  positive ETCO2 and breath sounds checked- equal and bilateral Secured at: 20 cm Tube secured with: Tape Dental Injury: Teeth and Oropharynx as per pre-operative assessment

## 2014-07-06 NOTE — Transfer of Care (Signed)
Immediate Anesthesia Transfer of Care Note  Patient: Angela Cook  Procedure(s) Performed: Procedure(s): LUMBAR FOUR FIVE POSTERIOR LUMBAR INTERBODY FUSION/REVISION PEEK SPACER ALLOGRAFT (N/A)  Patient Location: PACU  Anesthesia Type:General  Level of Consciousness: awake and alert   Airway & Oxygen Therapy: Patient Spontanous Breathing and Patient connected to nasal cannula oxygen  Post-op Assessment: Report given to PACU RN and Post -op Vital signs reviewed and stable  Post vital signs: Reviewed and stable  Complications: No apparent anesthesia complications

## 2014-07-06 NOTE — Anesthesia Postprocedure Evaluation (Signed)
  Anesthesia Post-op Note  Patient: Angela Cook  Procedure(s) Performed: Procedure(s): LUMBAR FOUR FIVE POSTERIOR LUMBAR INTERBODY FUSION/REVISION PEEK SPACER ALLOGRAFT (N/A)  Patient Location: PACU  Anesthesia Type:General  Level of Consciousness: awake, alert , oriented and patient cooperative  Airway and Oxygen Therapy: Patient Spontanous Breathing  Post-op Pain: mild  Post-op Assessment: Post-op Vital signs reviewed, Patient's Cardiovascular Status Stable, Respiratory Function Stable, Patent Airway, No signs of Nausea or vomiting and Pain level controlled  Post-op Vital Signs: stable  Last Vitals:  Filed Vitals:   07/06/14 1845  BP:   Pulse: 80  Temp:   Resp: 15    Complications: No apparent anesthesia complications

## 2014-07-06 NOTE — Progress Notes (Signed)
Pt unable to take ring off right ring finger. Ring taped and Dr. Berneice HeinrichManny notified.

## 2014-07-06 NOTE — Anesthesia Preprocedure Evaluation (Addendum)
Anesthesia Evaluation  Patient identified by MRN, date of birth, ID band Patient awake    Reviewed: Allergy & Precautions, H&P , NPO status , Patient's Chart, lab work & pertinent test results, reviewed documented beta blocker date and time   Airway Mallampati: II   Neck ROM: Full    Dental  (+) Teeth Intact   Pulmonary  breath sounds clear to auscultation        Cardiovascular negative cardio ROS  Rhythm:Regular     Neuro/Psych    GI/Hepatic negative GI ROS, Neg liver ROS,   Endo/Other  negative endocrine ROS  Renal/GU negative Renal ROS     Musculoskeletal   Abdominal (+)  Abdomen: soft.    Peds  Hematology   Anesthesia Other Findings   Reproductive/Obstetrics                          Anesthesia Physical Anesthesia Plan  ASA: II  Anesthesia Plan: General   Post-op Pain Management:    Induction: Intravenous  Airway Management Planned: Oral ETT  Additional Equipment:   Intra-op Plan:   Post-operative Plan: Extubation in OR  Informed Consent:   Plan Discussed with:   Anesthesia Plan Comments: (Consider 2nd IV after turning)        Anesthesia Quick Evaluation

## 2014-07-06 NOTE — H&P (Signed)
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  HISTORY OF PRESENT ILLNESS:                     Angela BrokerLynn Cook is a 58 year old right handed individual, who works as an ActuaryN and case manager.  In December of last year, she underwent surgical intervention by Dr. Herbert DeanerHarlan Daubert for grade 2 spondylolisthesis with significant stenosis.  She had a minimally invasive procedure done via bilateral Wiltse incisions and a unilateral graft placed from the right side.  She notes initially, she felt somewhat better, but had significant postoperative pain and after about four months, she was able to get back to the workplace; however, by six months, she was having worsening pain and followup x-rays apparently suggested that there may be some pseudoarthrosis that was forming.  About a month or two ago, she underwent a CT scan and this demonstrates that she has retropulsed her posterior lumbar interbody spacer into the canal causing moderately severe central canal stenosis.  In addition, there is evidence of pseudoarthrosis in the interspace with no bony formation across the interspace.  She furthermore has had radicular symptoms into both lower extremities.  She describes this radiating down the posterior buttock and thighs on both sides, but now pain below the knees.  She has no numbness or tingling into the feet.  She feels that her strength has been good and she has been continuing to work.  She notes however that by the end of the day, she is in terrible pain with pain in the back, the buttock, and the lower extremities.  She has been medicating this with only tramadol and Motrin.  She has not been using any narcotic analgesic as she knows that this makes her very sleepy.  She is hopeful to get something done to help ease the substantial pain that she has been experiencing.  Accompanying her today are a CT scan and an MRI that had been  performed on 04/13/2014 and 05/03/2014, the CT is the most recent study.  The study demonstrates the degree of central canal stenosis.  The hardware appears to be in good position with no evidence of any halo formation around the screws; however, there is no evidence of any incorporation of the bone grafts across the disc space itself.  Notes from Dr. Jeni Sallesaubert's office are also included and his plan was to do an anterior lumbar interbody decompression and arthrodesis.  REVIEW OF SYSTEMS:                                    Notable for use of glasses, nasal congestion, back pain, and leg pain on a 14-point review sheet.  PAST MEDICAL HISTORY:  . Current Medical Conditions:  Reveals that the patient's general health has been excellent.  She reports no significant medical problems whatsoever.  . Prior Operations:  Include a C-section x3, carpal tunnel surgery on the right and the left, and a bunion surgery in 2014 is the most recent surgery prior to the back.  . Medications and Allergies:  Current medications include Ultram, Motrin, Tylenol, Celexa, and trazodone.  SOCIAL HISTORY:  Reveals she does not smoke or use alcohol.  PHYSICAL EXAMINATION:                                Height and weight have been stable at 61 inches and 150 pounds.  On physical examination, she stands straight and erect without difficulty.  Her motor function in the major groups including the iliopsoas, quadriceps, tibialis anterior, and gastrocs all is intact.  Her sensation is intact to vibration at both ankles.  Patellar reflexes are 1+ and Achilles reflexes are 2+ bilaterally.  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  I indicated that my choice of procedure for this process would be to redo a posterior decompression, but do a bilateral approach to the disc  space in an effort to remove as much of the remaining disc and the pseudoarthrosis as possible and reconstruct with posterior lumbar interbody spacers bilaterally.  Likely, the hardware would have to be loosened to allow access into the interbody space and also to allow some repositioning and realignment of her spine.  We would want to reestablish as much lordosis as possible to maintain her back in the neutral position.  I also discussed the use of a bone fusion enhancer such as INFUSE in an effort to try to induce bony growth.  I believe that autograft from the opposite laminar arch should be adequate and this can be mixed with some demineralized bone matrix to enhance growth.  I discussed the major risks of the surgery, which I believe include the potential for spinal fluid leak and also the potential for nerve root injury.  The surgery does have those risks in that it is a redo operation, but hopefully they can be minimized with a careful and gentle dissection.  Beyond that, there is a risk of substantial blood loss, but I would hope that we could do this without necessitating a transfusion.  I believe the chance of needing a transfusion is probably less than 10%.  At this time, the patient is neurologically intact.  That having been said, it is an issue of pain management for her.  If she is tolerating the pain, we can schedule the surgery at her convenience.  She is eager to get something done in the near future and we will work with her to schedule at the most appropriate time.

## 2014-07-06 NOTE — Op Note (Signed)
Date of surgery: 07/06/2014 Preoperative diagnosis: Pseudoarthrosis L4-L5 with displaced interbody spacer causing severe right-sided canal compromise Postoperative diagnosis: Pseudoarthrosis L4-5 with displaced interbody spacer causing severe right sided canal compromise Procedure: Revision of posterior interbody arthrodesis removing displaced peek spacer and revising posterior interbody arthrodesis with new peek spacers local autograft and allograft Surgeon: Barnett AbuHenry Eduin Friedel Asst.: Hilda LiasErnesto Botero M.D. Anesthesia: Gen. endotracheal Indications: Angela Cook is a 58 year old individual who's had significant back and right lower extremity pain year and half ago she underwent decompression using a minimum early invasive technique for posterior interbody arthrodesis with placement of a peek spacer. Over time it appears that the peek spacer has displaced itself into the spinal canal causing significant spinal stenosis and right-sided foraminal stenosis. Patient has been advised regarding revision surgery.  Procedure: Patient was brought to the operating supine on a stretcher. After the smooth induction of general endotracheal anesthesia she was turned prone. The back was prepped with alcohol and DuraPrep and draped in a sterile fashion. A midline incision was made at the level of L4-L5. Dissection was taken down either side of the spinous processes and lamina and a subperiosteal fashion. The interlaminar space at L4-L5 was then dissected and decompressed. On the left side a laminotomy was created removing the inferior margin lamina out to and including the entirety of the facet that is the superior articular process was exposed at L5. Lateral dissection was undertaken to expose the takeoff the L4 nerve root superiorly. L5 nerve root was decompressed inferiorly. The dura was identified. The disc space was then identified and incised with a 15 blade and accommodation of curettes and rongeurs was used to evacuate a  substantial amount of severely degenerated and desiccated disc material. The contralateral peek spacer was identified. Then a right-sided laminotomy and foraminotomies was created. Here the dura was noted to be severely scarred is gently mobilized around the peek spacer which was exposed and then gradually the peek spacer itself was mobilized. This required some tools inserted into the peek spacer and gently tapped it to and from releasing it from fascial attachments. The dura was carefully protected and retracted to the contralateral side which was now decompressed. Ultimately the peek spacer was removed. The disc space was entered and a significant quantity of degenerated disc material along with some bony fragments were removed from this region. There is clear evidence of a pseudoarthrosis in this level. The endplates were then completely decorticated from left to right top to bottom. A trial of a 10 mm 12 lordotic peek spacer was then tried to reestablish some lordosis at the L4-L5 level. These fit well radiographically and then to peek spacers were filled with autograft and allograft which was harvested from the lamina allograft was in the form of cortical cancellus bone chips and an extra small mixture of infuse was used to pack the interspace from left to right top to bottom with this mixture. This was done in around to peek spacers which were placed. Once the interspace was filled with bone graft care was taken to make sure that the L4 and L5 nerve roots were well decompressed. It is noted that there was a small dural tear with exposed arachnoid no spinal fluid leakage lateral aspect of the sac on the right side near with a cage was. This was closed with a singular 6-0 Prolene suture in a figure-of-eight fashion. No spinal fluid leakage was encountered. With this the wound was inspected hemostasis was achieved and then the lumbar dorsal fascia  was reapproximated with #1 Vicryl 2-0 Vicryl was used in the  subcutaneous tissues 3-0 Vicryl subcuticularly 10 mL of half Marcaine was injected into the paraspinous fascia before final closure. Blood loss is estimated 350 mL. The patient tolerated the procedure well was returned to recovery room stable condition.

## 2014-07-07 NOTE — Evaluation (Addendum)
Occupational Therapy Evaluation Patient Details Name: Angela Cook MRN: [id number] DOB: May 15, 1956 Today's Date: 07/07/2014    History of Present Illness 58 y.o. s/p LUMBAR FOUR FIVE POSTERIOR LUMBAR INTERBODY FUSION/REVISION PEEK SPACER ALLOGRAFT.   Clinical Impression   Pt s/p above. Education provided during session and pt's spouse also present. Feel pt will be safe to d/c home with spouse available to assist. OT signing off.     Follow Up Recommendations  No OT follow up;Supervision - Intermittent    Equipment Recommendations  Other (comment) (toilet aide)    Recommendations for Other Services       Precautions / Restrictions Precautions Precautions: Back Precaution Booklet Issued: Yes (comment) Precaution Comments: educated on precautions Required Braces or Orthoses: Spinal Brace Spinal Brace: Lumbar corset;Applied in sitting position Restrictions Weight Bearing Restrictions: No      Mobility Bed Mobility Overal bed mobility: Needs Assistance Bed Mobility: Rolling;Sidelying to Sit;Sit to Sidelying Rolling: Supervision;Modified independent (Device/Increase time) Sidelying to sit: Supervision;Modified independent (Device/Increase time)     Sit to sidelying: Modified independent (Device/Increase time) General bed mobility comments: cues for technique.  Transfers Overall transfer level: Modified independent                    Balance                                            ADL Overall ADL's : Needs assistance/impaired                 Upper Body Dressing : Set up;Supervision/safety;Sitting;Standing   Lower Body Dressing: Set up;Supervision/safety;Sit to/from stand   Toilet Transfer: Modified Independent;Ambulation (chair/bed)   Toileting- Clothing Manipulation and Hygiene: Sit to/from stand;Moderate assistance       Functional mobility during ADLs: Modified independent General ADL Comments: Educated on safety  (safe  shoewear, sitting for most of LB ADLs, rugs, recommended spouse be with her for tub transfer). Educated on tub transfer technique. Educated on back brace. Discussed positioning of pillows. Educated on use of long sponge to help was buttocks and use of toilet aide for hygiene (discussed what pt could use for toilet aide at home). Educated where she could purchase toilet aide/cost.  Told pt additonal AE is available for LB ADLs if crossing legs become an issue (able to cross feet over knees today). Discussed incorporating precautions into functional activities. Discussed car transfer.     Vision                     Perception     Praxis      Pertinent Vitals/Pain Pain Assessment: 0-10 Pain Score: 3  Pain Location: back Pain Descriptors / Indicators: Sore Pain Intervention(s): Monitored during session     Hand Dominance Right   Extremity/Trunk Assessment Upper Extremity Assessment Upper Extremity Assessment: Overall WFL for tasks assessed   Lower Extremity Assessment Lower Extremity Assessment: Overall WFL for tasks assessed       Communication Communication Communication: No difficulties   Cognition Arousal/Alertness: Awake/alert Behavior During Therapy: WFL for tasks assessed/performed Overall Cognitive Status: Within Functional Limits for tasks assessed                     General Comments       Exercises       Shoulder Instructions      Home  Living Family/patient expects to be discharged to:: Private residence Living Arrangements: Spouse/significant other Available Help at Discharge: Family;Available 24 hours/day Type of Home: House Home Access: Stairs to enter Entergy CorporationEntrance Stairs-Number of Steps: 2 Entrance Stairs-Rails: None Home Layout: Two level;Able to live on main level with bedroom/bathroom     Bathroom Shower/Tub: Chief Strategy OfficerTub/shower unit   Bathroom Toilet: Standard                Prior Functioning/Environment Level of Independence:  Independent             OT Diagnosis: Acute pain   OT Problem List:     OT Treatment/Interventions:      OT Goals(Current goals can be found in the care plan section)    OT Frequency:     Barriers to D/C:            Co-evaluation              End of Session Equipment Utilized During Treatment: Gait belt;Back brace  Activity Tolerance: Patient tolerated treatment well Patient left: in chair;with call bell/phone within reach;with family/visitor present   Time: 3664-40341056-1128 OT Time Calculation (min): 32 min Charges:  OT General Charges $OT Visit: 1 Procedure OT Evaluation $Initial OT Evaluation Tier I: 1 Procedure OT Treatments $Self Care/Home Management : 8-22 mins G-CodesEarlie Cook:    Angela Cook L OTR/L [phone] 07/07/2014, 12:33 PM

## 2014-07-07 NOTE — Progress Notes (Signed)
Nutrition Brief Note  Patient identified on the Malnutrition Screening Tool (MST) Report  Wt Readings from Last 15 Encounters:  07/06/14 169 lb 12.8 oz (77.021 kg)    Body mass index is 32.1 kg/(m^2). Patient meets criteria for class I obesity based on current BMI. Pt reports appetite is great currently and PTA at home eating 3 full meals daily  Current diet order is regular, patient is consuming approximately 100% of meals at this time. Labs and medications reviewed.   No nutrition interventions warranted at this time. If nutrition issues arise, please consult RD.   Marijean NiemannStephanie La, MS, RD, LDN Pager # [phone] After hours/ weekend pager # [phone]

## 2014-07-07 NOTE — Progress Notes (Signed)
Patient ID: Angela Cook, female   DOB: Mar 06, 1956, 58 y.o.   MRN: [id number] Vital signs are stable Motor function appears intact Dressing is clean and dry Left leg pain seems improved Moderate back soreness is appropriate for degree of surgery Continues to work on ambulation to achieve more independence

## 2014-07-07 NOTE — Progress Notes (Signed)
PT Cancellation Note  Patient Details Name: Natalia LeatherwoodLynn P Pennie MRN: [id number] DOB: 1955-07-24   Cancelled Treatment:    Reason Eval/Treat Not Completed: PT screened, no needs identified, will sign off OT notified PT that pt is Mod I for all mobility and has 24/7 supervision from husband at home. Pt with recent back surgery and understands all precautions. Pt does not want PT services and per OT pt does not need them. Screened and discharged from therapy.   Alvie HeidelbergFolan, Anjalee Cope A 07/07/2014, 12:40 PM  Alvie HeidelbergShauna Folan, PT, DPT [phone]

## 2014-07-08 NOTE — Progress Notes (Signed)
Utilization Review Completed.Angela Cook T12/20/2015  

## 2014-07-08 NOTE — Progress Notes (Signed)
Patient ID: Angela Cook, female   DOB: [birth year], 58 y.o.   MRN: [id number] BP 101/40 mmHg  Pulse 65  Temp(Src) 98 F (36.7 C) (Oral)  Resp 20  Ht 5\' 1"  (1.549 m)  Wt 77.021 kg (169 lb 12.8 oz)  BMI 32.10 kg/m2  SpO2 100% Alert and oriented x 4, speech clear and fluent Moving lower extremities well Continued severe pain in the right lower extremity at times Possible dischArge tomorrow

## 2014-07-08 NOTE — Progress Notes (Signed)
CSW (Clinical Child psychotherapistocial Worker) aware of consult. At this time, PT/OT are recommending no follow up. Pt has no social work needs. Please reconsult should needs arise.   Quenesha Douglass Lajean Savermbelal, LCSWA Weekend CSW [phone]

## 2014-07-09 ENCOUNTER — Inpatient Hospital Stay (HOSPITAL_COMMUNITY): Payer: Managed Care, Other (non HMO)

## 2014-07-09 MED ORDER — DEXAMETHASONE 2 MG PO TABS
2.0000 mg | ORAL_TABLET | Freq: Three times a day (TID) | ORAL | Status: DC
  Administered 2014-07-09 – 2014-07-10 (×4): 2 mg via ORAL
  Filled 2014-07-09 (×4): qty 1

## 2014-07-09 NOTE — Progress Notes (Signed)
Patient ID: Angela LeatherwoodLynn P Durfee, female   DOB: 08-10-1955, 58 y.o.   MRN: [id number] Vital signs are stable. Patient has been having increasing right leg pain. His been bothersome for an reminds her of her previous surgical intervention. Motor strength appears intact in lower extremities and the incision is clean and dry. Nonetheless she's not been ambulating today because of the severe right leg pain. We will start her on some Decadron. I'll obtain a CT scan of the lumbar spine to make sure that her hardware is in good position.

## 2014-07-09 NOTE — Plan of Care (Signed)
Problem: Consults Goal: Diagnosis - Spinal Surgery Outcome: Completed/Met Date Met:  07/09/14 Microdiscectomy

## 2014-07-10 MED ORDER — OXYCODONE-ACETAMINOPHEN 5-325 MG PO TABS: 1.0000 | ORAL_TABLET | ORAL | Status: DC | PRN

## 2014-07-10 MED ORDER — DEXAMETHASONE 1 MG PO TABS: ORAL_TABLET | ORAL | Status: DC

## 2014-07-10 MED FILL — Heparin Sodium (Porcine) Inj 1000 Unit/ML: INTRAMUSCULAR | Qty: 30 | Status: AC

## 2014-07-10 MED FILL — Sodium Chloride IV Soln 0.9%: INTRAVENOUS | Qty: 1000 | Status: AC

## 2014-07-10 NOTE — Discharge Summary (Signed)
Physician Discharge Summary  Patient ID: Angela Cook MRN: [id number] DOB/AGE: [birth year] 58 y.o.  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 She had some residual leg pain which improved with this course of Decadron taper  Consults: None  Significant Diagnostic Studies: None  Treatments: surgery: Vision of posterior lumbar interbody arthrodesis using peek spacers autograft and allograft  Discharge Exam: Blood pressure 114/59, pulse 64, temperature 98 F (36.7 C), temperature source Oral, resp. rate 18, height 5\' 1"  (1.549 m), weight 77.021 kg (169 lb 12.8 oz), SpO2 98 %. Incision is clean and dry motor function is good in both lower extremities  Disposition: Discharge home  Discharge Instructions    Call MD for:  redness, tenderness, or signs of infection (pain, swelling, redness, odor or green/yellow discharge around incision site)    Complete by:  As directed      Call MD for:  severe uncontrolled pain    Complete by:  As directed      Call MD for:  temperature >100.4    Complete by:  As directed      Diet - low sodium heart healthy    Complete by:  As directed      Discharge instructions    Complete by:  As directed   Okay to shower. Do not apply salves or appointments to incision. No heavy lifting with the upper extremities greater than 15 pounds. May resume driving when not requiring pain medication and patient feels comfortable with doing so.     Increase activity slowly    Complete by:  As directed             Medication List    TAKE these medications        citalopram 40 MG tablet  Commonly known as:  CELEXA  Take 40 mg  by mouth daily.     dexamethasone 1 MG tablet  Commonly known as:  DECADRON  2 tablets twice daily for 2 days, one tablet twice daily for 2 days, one tablet daily for 2 days.     oxyCODONE-acetaminophen 5-325 MG per tablet  Commonly known as:  PERCOCET/ROXICET  Take 1-2 tablets by mouth every 4 (four) hours as needed for moderate pain.     traMADol 50 MG tablet  Commonly known as:  ULTRAM  Take 100 mg by mouth every morning.     traZODone 50 MG tablet  Commonly known as:  DESYREL  Take 50 mg by mouth at bedtime.         SignedStefani Dama: Gannon Heinzman J 07/10/2014, 10:51 AM

## 2014-07-10 NOTE — Progress Notes (Signed)
Pt discharging at this time with her husband. Pt alert, verbal with no complaints of pain or discomfort. Tolerated meds without difficulty. Surgical site clean and dry with no drainage. Brace aligned and in place. Discharge education and instructions provided with pt with prescriptions with verbal understanding. Pt to make a follow up appt. All personal belongings sent home.

## 2015-04-19 ENCOUNTER — Other Ambulatory Visit (HOSPITAL_BASED_OUTPATIENT_CLINIC_OR_DEPARTMENT_OTHER): Payer: Self-pay | Admitting: Family Medicine

## 2015-04-19 DIAGNOSIS — Z1231 Encounter for screening mammogram for malignant neoplasm of breast: Secondary | ICD-10-CM

## 2015-04-29 ENCOUNTER — Ambulatory Visit (HOSPITAL_BASED_OUTPATIENT_CLINIC_OR_DEPARTMENT_OTHER)
Admission: RE | Admit: 2015-04-29 | Discharge: 2015-04-29 | Disposition: A | Discharge status: Home / Self Care | Payer: Managed Care, Other (non HMO) | Source: Ambulatory Visit | Attending: Family Medicine | Admitting: Family Medicine

## 2015-04-29 DIAGNOSIS — Z1231 Encounter for screening mammogram for malignant neoplasm of breast: Secondary | ICD-10-CM

## 2015-11-26 ENCOUNTER — Other Ambulatory Visit: Payer: Self-pay | Admitting: Neurological Surgery

## 2015-11-26 DIAGNOSIS — M5416 Radiculopathy, lumbar region: Secondary | ICD-10-CM

## 2015-12-05 ENCOUNTER — Ambulatory Visit
Admission: RE | Admit: 2015-12-05 | Discharge: 2015-12-05 | Disposition: A | Discharge status: Home / Self Care | Payer: Managed Care, Other (non HMO) | Source: Ambulatory Visit | Attending: Neurological Surgery | Admitting: Neurological Surgery

## 2015-12-05 DIAGNOSIS — M5416 Radiculopathy, lumbar region: Secondary | ICD-10-CM

## 2015-12-05 MED ORDER — GADOBENATE DIMEGLUMINE 529 MG/ML IV SOLN
15.0000 mL | Freq: Once | INTRAVENOUS | Status: AC | PRN
Start: 2015-12-05 — End: 2015-12-05
  Administered 2015-12-05: 15 mL via INTRAVENOUS

## 2015-12-06 ENCOUNTER — Other Ambulatory Visit (HOSPITAL_BASED_OUTPATIENT_CLINIC_OR_DEPARTMENT_OTHER): Payer: Self-pay | Admitting: Family Medicine

## 2015-12-06 DIAGNOSIS — Z780 Asymptomatic menopausal state: Secondary | ICD-10-CM

## 2015-12-09 ENCOUNTER — Ambulatory Visit (HOSPITAL_BASED_OUTPATIENT_CLINIC_OR_DEPARTMENT_OTHER)
Admission: RE | Admit: 2015-12-09 | Discharge: 2015-12-09 | Disposition: A | Discharge status: Home / Self Care | Payer: Managed Care, Other (non HMO) | Source: Ambulatory Visit | Attending: Family Medicine | Admitting: Family Medicine

## 2015-12-09 DIAGNOSIS — M8588 Other specified disorders of bone density and structure, other site: Secondary | ICD-10-CM | POA: Diagnosis not present

## 2015-12-09 DIAGNOSIS — Z780 Asymptomatic menopausal state: Secondary | ICD-10-CM

## 2015-12-09 DIAGNOSIS — M8580 Other specified disorders of bone density and structure, unspecified site: Secondary | ICD-10-CM | POA: Diagnosis present

## 2015-12-09 DIAGNOSIS — M85852 Other specified disorders of bone density and structure, left thigh: Secondary | ICD-10-CM | POA: Diagnosis not present

## 2015-12-20 ENCOUNTER — Other Ambulatory Visit: Payer: Self-pay | Admitting: Neurological Surgery

## 2016-01-01 NOTE — Pre-Procedure Instructions (Signed)
Angela Cook  01/01/2016      [facility] - [place], [place] - [address] [place] [address] Phone: [phone] Fax: [phone]    Your procedure is scheduled on Tues, June 20 @ 12:45 PM  [facility] Ad[facility] at 9:45 AM  Call this number if you have problems the morning of surgery:  [phone]   Remember:  Do not eat food or drink liquids after midnight.  Take these medicines the morning of surgery with A SIP OF WATER Celexa(Citalopram) and Tramadol(Ultram)             Stop taking any Goody's,BC's,Aleve,Aspirin,Ibuprofen,Motrin,Advil,Fish Oil,or any Herbal Medications a week prior to surgery.   Do not wear jewelry, make-up or nail polish.  Do not wear lotions, powders, or perfumes.    Do not shave 48 hours prior to surgery.    Do not bring valuables to the hospital.  [facility] is not responsible for any belongings or valuables.  Contacts, dentures or bridgework may not be worn into surgery.  Leave your suitcase in the car.  After surgery it may be brought to your room.  For patients admitted to the hospital, discharge time will be determined by your treatment team.  Patients discharged the day of surgery will not be allowed to drive home.    Special instructions:  [place] - Preparing for Surgery  Before surgery, you can play an important role.  Because skin is not sterile, your skin needs to be as free of germs as possible.  You can reduce the number of germs on you skin by washing with CHG (chlorahexidine gluconate) soap before surgery.  CHG is an antiseptic cleaner which kills germs and bonds with the skin to continue killing germs even after washing.  Please DO NOT use if you have an allergy to CHG or antibacterial soaps.  If your skin becomes reddened/irritated stop using the CHG and inform your nurse when you arrive at Short Stay.  Do not shave (including legs and  underarms) for at least 48 hours prior to the first CHG shower.  You may shave your face.  Please follow these instructions carefully:   1.  Shower with CHG Soap the night before surgery and the                                morning of Surgery.  2.  If you choose to wash your hair, wash your hair first as usual with your       normal shampoo.  3.  After you shampoo, rinse your hair and body thoroughly to remove the                      Shampoo.  4.  Use CHG as you would any other liquid soap.  You can apply chg directly       to the skin and wash gently with scrungie or a clean washcloth.  5.  Apply the CHG Soap to your body ONLY FROM THE NECK DOWN.        Do not use on open wounds or open sores.  Avoid contact with your eyes,       ears, mouth and genitals (private parts).  Wash genitals (private parts)       with your normal soap.  6.  Wash thoroughly, paying special attention to the area where your surgery        will be performed.  7.  Thoroughly rinse your body with warm water from the neck down.  8.  DO NOT shower/wash with your normal soap after using and rinsing off       the CHG Soap.  9.  Pat yourself dry with a clean towel.            10.  Wear clean pajamas.            11.  Place clean sheets on your bed the night of your first shower and do not        sleep with pets.  Day of Surgery  Do not apply any lotions/deoderants the morning of surgery.  Please wear clean clothes to the hospital/surgery center.    Please read over the following fact sheets that you were given. MRSA Information

## 2016-01-02 ENCOUNTER — Other Ambulatory Visit: Payer: Self-pay | Admitting: Neurological Surgery

## 2016-01-02 ENCOUNTER — Encounter (HOSPITAL_COMMUNITY): Payer: Self-pay

## 2016-01-02 ENCOUNTER — Encounter (HOSPITAL_COMMUNITY)
Admission: RE | Admit: 2016-01-02 | Discharge: 2016-01-02 | Disposition: A | Discharge status: Home / Self Care | Payer: Managed Care, Other (non HMO) | Source: Ambulatory Visit | Attending: Neurological Surgery | Admitting: Neurological Surgery

## 2016-01-02 DIAGNOSIS — Z01812 Encounter for preprocedural laboratory examination: Secondary | ICD-10-CM | POA: Insufficient documentation

## 2016-01-02 DIAGNOSIS — M4806 Spinal stenosis, lumbar region: Secondary | ICD-10-CM | POA: Diagnosis not present

## 2016-01-02 LAB — BASIC METABOLIC PANEL
ANION GAP: 6 (ref 5–15)
BUN: 14 mg/dL (ref 6–20)
CALCIUM: 9.4 mg/dL (ref 8.9–10.3)
CHLORIDE: 104 mmol/L (ref 101–111)
CO2: 29 mmol/L (ref 22–32)
Creatinine, Ser: 0.88 mg/dL (ref 0.44–1.00)
GFR calc non Af Amer: >60 mL/min (ref >60)
Glucose, Bld: 103 mg/dL — ABNORMAL HIGH (ref 65–99)
Potassium: 4.2 mmol/L (ref 3.5–5.1)
Sodium: 139 mmol/L (ref 135–145)

## 2016-01-02 LAB — CBC
HEMATOCRIT: 40.3 % (ref 36.0–46.0)
HEMOGLOBIN: 13.4 g/dL (ref 12.0–15.0)
MCH: 29.5 pg (ref 26.0–34.0)
MCHC: 33.3 g/dL (ref 30.0–36.0)
MCV: 88.8 fL (ref 78.0–100.0)
Platelets: 162 10*3/uL (ref 150–400)
RBC: 4.54 MIL/uL (ref 3.87–5.11)
RDW: 13.6 % (ref 11.5–15.5)
WBC: 5.2 10*3/uL (ref 4.0–10.5)

## 2016-01-02 LAB — SURGICAL PCR SCREEN
MRSA, PCR: NEGATIVE
Staphylococcus aureus: POSITIVE — AB

## 2016-01-02 NOTE — Progress Notes (Signed)
Pt. Reports that she had an ECHO, > 1941yrs. Ago, states she was on a thyroid supplement & really didn't need it., later taken off & ECHO proved to be  WNL.

## 2016-01-06 MED ORDER — CEFAZOLIN SODIUM-DEXTROSE 2-4 GM/100ML-% IV SOLN
2.0000 g | INTRAVENOUS | Status: AC
  Administered 2016-01-07: 2 g via INTRAVENOUS
  Filled 2016-01-06: qty 100

## 2016-01-07 ENCOUNTER — Ambulatory Visit (HOSPITAL_COMMUNITY): Payer: Managed Care, Other (non HMO)

## 2016-01-07 ENCOUNTER — Ambulatory Visit (HOSPITAL_COMMUNITY): Payer: Managed Care, Other (non HMO) | Admitting: Certified Registered"

## 2016-01-07 ENCOUNTER — Observation Stay (HOSPITAL_COMMUNITY)
Admission: RE | Admit: 2016-01-07 | Discharge: 2016-01-08 | Disposition: A | Discharge status: Home / Self Care | Payer: Managed Care, Other (non HMO) | Source: Ambulatory Visit | Attending: Neurological Surgery | Admitting: Neurological Surgery

## 2016-01-07 ENCOUNTER — Encounter (HOSPITAL_COMMUNITY): Payer: Self-pay | Admitting: Certified Registered"

## 2016-01-07 ENCOUNTER — Encounter (HOSPITAL_COMMUNITY)
Admission: RE | Disposition: A | Discharge status: Home / Self Care | Payer: Self-pay | Source: Ambulatory Visit | Attending: Neurological Surgery

## 2016-01-07 DIAGNOSIS — Z419 Encounter for procedure for purposes other than remedying health state, unspecified: Secondary | ICD-10-CM

## 2016-01-07 DIAGNOSIS — M4726 Other spondylosis with radiculopathy, lumbar region: Secondary | ICD-10-CM | POA: Insufficient documentation

## 2016-01-07 DIAGNOSIS — M4806 Spinal stenosis, lumbar region: Secondary | ICD-10-CM | POA: Diagnosis not present

## 2016-01-07 DIAGNOSIS — M48062 Spinal stenosis, lumbar region with neurogenic claudication: Secondary | ICD-10-CM | POA: Diagnosis present

## 2016-01-07 HISTORY — PX: LUMBAR LAMINECTOMY/DECOMPRESSION MICRODISCECTOMY: SHX5026

## 2016-01-07 SURGERY — LUMBAR LAMINECTOMY/DECOMPRESSION MICRODISCECTOMY 1 LEVEL
Anesthesia: General | Site: Spine Lumbar | Laterality: Bilateral

## 2016-01-07 MED ORDER — SODIUM CHLORIDE 0.9% FLUSH
3.0000 mL | Freq: Two times a day (BID) | INTRAVENOUS | Status: DC
  Administered 2016-01-07: 3 mL via INTRAVENOUS

## 2016-01-07 MED ORDER — SUGAMMADEX SODIUM 200 MG/2ML IV SOLN
INTRAVENOUS | Status: DC | PRN
  Administered 2016-01-07: 200 mg via INTRAVENOUS

## 2016-01-07 MED ORDER — EPHEDRINE SULFATE 50 MG/ML IJ SOLN
INTRAMUSCULAR | Status: DC | PRN
  Administered 2016-01-07 (×4): 5 mg via INTRAVENOUS

## 2016-01-07 MED ORDER — THROMBIN 5000 UNITS EX SOLR
CUTANEOUS | Status: DC | PRN
  Administered 2016-01-07 (×2): 5000 [IU] via TOPICAL

## 2016-01-07 MED ORDER — LIDOCAINE-EPINEPHRINE 1 %-1:100000 IJ SOLN
INTRAMUSCULAR | Status: DC | PRN
  Administered 2016-01-07: 5 mL

## 2016-01-07 MED ORDER — CEFAZOLIN SODIUM 1-5 GM-% IV SOLN
1.0000 g | Freq: Three times a day (TID) | INTRAVENOUS | Status: AC
  Administered 2016-01-07 – 2016-01-08 (×2): 1 g via INTRAVENOUS
  Filled 2016-01-07 (×2): qty 50

## 2016-01-07 MED ORDER — METHOCARBAMOL 1000 MG/10ML IJ SOLN
500.0000 mg | Freq: Four times a day (QID) | INTRAVENOUS | Status: DC | PRN
  Filled 2016-01-07: qty 5

## 2016-01-07 MED ORDER — DEXAMETHASONE SODIUM PHOSPHATE 10 MG/ML IJ SOLN
INTRAMUSCULAR | Status: AC
  Filled 2016-01-07: qty 1

## 2016-01-07 MED ORDER — MIDAZOLAM HCL 5 MG/5ML IJ SOLN
INTRAMUSCULAR | Status: DC | PRN
  Administered 2016-01-07: 2 mg via INTRAVENOUS

## 2016-01-07 MED ORDER — SODIUM CHLORIDE 0.9% FLUSH: 3.0000 mL | INTRAVENOUS | Status: DC | PRN

## 2016-01-07 MED ORDER — TRAZODONE 25 MG HALF TABLET
25.0000 mg | ORAL_TABLET | Freq: Every day | ORAL | Status: DC
  Administered 2016-01-07: 25 mg via ORAL
  Filled 2016-01-07: qty 1

## 2016-01-07 MED ORDER — PHENYLEPHRINE 40 MCG/ML (10ML) SYRINGE FOR IV PUSH (FOR BLOOD PRESSURE SUPPORT)
PREFILLED_SYRINGE | INTRAVENOUS | Status: AC
  Filled 2016-01-07: qty 10

## 2016-01-07 MED ORDER — ACETAMINOPHEN 650 MG RE SUPP: 650.0000 mg | RECTAL | Status: DC | PRN

## 2016-01-07 MED ORDER — SODIUM CHLORIDE 0.9 % IV SOLN: 250.0000 mL | INTRAVENOUS | Status: DC

## 2016-01-07 MED ORDER — FENTANYL CITRATE (PF) 100 MCG/2ML IJ SOLN
25.0000 ug | INTRAMUSCULAR | Status: DC | PRN
  Administered 2016-01-07 (×3): 50 ug via INTRAVENOUS

## 2016-01-07 MED ORDER — ROCURONIUM BROMIDE 100 MG/10ML IV SOLN
INTRAVENOUS | Status: DC | PRN
Start: 2016-01-07 — End: 2016-01-07
  Administered 2016-01-07: 50 mg via INTRAVENOUS

## 2016-01-07 MED ORDER — PHENOL 1.4 % MT LIQD
1.0000 | OROMUCOSAL | Status: DC | PRN
Start: 2016-01-07 — End: 2016-01-08

## 2016-01-07 MED ORDER — ARTIFICIAL TEARS OP OINT
TOPICAL_OINTMENT | OPHTHALMIC | Status: DC | PRN
  Administered 2016-01-07: 1 via OPHTHALMIC

## 2016-01-07 MED ORDER — PROPOFOL 10 MG/ML IV BOLUS
INTRAVENOUS | Status: DC | PRN
Start: 2016-01-07 — End: 2016-01-07
  Administered 2016-01-07: 100 mg via INTRAVENOUS

## 2016-01-07 MED ORDER — FENTANYL CITRATE (PF) 100 MCG/2ML IJ SOLN
INTRAMUSCULAR | Status: DC | PRN
  Administered 2016-01-07: 100 ug via INTRAVENOUS

## 2016-01-07 MED ORDER — LIDOCAINE HCL (CARDIAC) 20 MG/ML IV SOLN
INTRAVENOUS | Status: DC | PRN
  Administered 2016-01-07: 100 mg via INTRAVENOUS

## 2016-01-07 MED ORDER — METHOCARBAMOL 500 MG PO TABS
500.0000 mg | ORAL_TABLET | Freq: Four times a day (QID) | ORAL | Status: DC | PRN
  Administered 2016-01-07: 500 mg via ORAL
  Filled 2016-01-07: qty 1

## 2016-01-07 MED ORDER — ALUM & MAG HYDROXIDE-SIMETH 200-200-20 MG/5ML PO SUSP: 30.0000 mL | Freq: Four times a day (QID) | ORAL | Status: DC | PRN

## 2016-01-07 MED ORDER — ONDANSETRON HCL 4 MG/2ML IJ SOLN: 4.0000 mg | INTRAMUSCULAR | Status: DC | PRN

## 2016-01-07 MED ORDER — ONDANSETRON HCL 4 MG/2ML IJ SOLN: 4.0000 mg | Freq: Once | INTRAMUSCULAR | Status: DC | PRN

## 2016-01-07 MED ORDER — MENTHOL 3 MG MT LOZG: 1.0000 | LOZENGE | OROMUCOSAL | Status: DC | PRN

## 2016-01-07 MED ORDER — KETOROLAC TROMETHAMINE 15 MG/ML IJ SOLN
15.0000 mg | Freq: Four times a day (QID) | INTRAMUSCULAR | Status: DC
  Administered 2016-01-07 – 2016-01-08 (×4): 15 mg via INTRAVENOUS
  Filled 2016-01-07 (×3): qty 1

## 2016-01-07 MED ORDER — BUPIVACAINE HCL (PF) 0.5 % IJ SOLN
INTRAMUSCULAR | Status: DC | PRN
  Administered 2016-01-07: 5 mL

## 2016-01-07 MED ORDER — DOCUSATE SODIUM 100 MG PO CAPS
100.0000 mg | ORAL_CAPSULE | Freq: Two times a day (BID) | ORAL | Status: DC
  Administered 2016-01-07: 100 mg via ORAL
  Filled 2016-01-07: qty 1

## 2016-01-07 MED ORDER — CYCLOBENZAPRINE HCL 5 MG PO TABS: 5.0000 mg | ORAL_TABLET | Freq: Every evening | ORAL | Status: DC | PRN

## 2016-01-07 MED ORDER — 0.9 % SODIUM CHLORIDE (POUR BTL) OPTIME
TOPICAL | Status: DC | PRN
  Administered 2016-01-07: 1000 mL

## 2016-01-07 MED ORDER — EPHEDRINE 5 MG/ML INJ
INTRAVENOUS | Status: AC
  Filled 2016-01-07: qty 10

## 2016-01-07 MED ORDER — METHOCARBAMOL 500 MG PO TABS
ORAL_TABLET | ORAL | Status: AC
  Filled 2016-01-07: qty 1

## 2016-01-07 MED ORDER — LACTATED RINGERS IV SOLN
INTRAVENOUS | Status: DC
  Administered 2016-01-07: 16:00:00 via INTRAVENOUS

## 2016-01-07 MED ORDER — LACTATED RINGERS IV SOLN
INTRAVENOUS | Status: DC | PRN
  Administered 2016-01-07 (×2): via INTRAVENOUS

## 2016-01-07 MED ORDER — OXYCODONE-ACETAMINOPHEN 5-325 MG PO TABS
1.0000 | ORAL_TABLET | ORAL | Status: DC | PRN
  Administered 2016-01-07: 2 via ORAL
  Administered 2016-01-07: 1 via ORAL
  Administered 2016-01-08: 2 via ORAL
  Filled 2016-01-07 (×3): qty 2

## 2016-01-07 MED ORDER — BISACODYL 10 MG RE SUPP: 10.0000 mg | Freq: Every day | RECTAL | Status: DC | PRN

## 2016-01-07 MED ORDER — SENNA 8.6 MG PO TABS
1.0000 | ORAL_TABLET | Freq: Two times a day (BID) | ORAL | Status: DC
  Administered 2016-01-07: 8.6 mg via ORAL
  Filled 2016-01-07: qty 1

## 2016-01-07 MED ORDER — PHENYLEPHRINE HCL 10 MG/ML IJ SOLN
INTRAMUSCULAR | Status: DC | PRN
  Administered 2016-01-07 (×2): 80 ug via INTRAVENOUS
  Administered 2016-01-07: 40 ug via INTRAVENOUS

## 2016-01-07 MED ORDER — MORPHINE SULFATE (PF) 2 MG/ML IV SOLN: 1.0000 mg | INTRAVENOUS | Status: DC | PRN

## 2016-01-07 MED ORDER — FENTANYL CITRATE (PF) 100 MCG/2ML IJ SOLN
INTRAMUSCULAR | Status: AC
  Filled 2016-01-07: qty 2

## 2016-01-07 MED ORDER — DEXAMETHASONE SODIUM PHOSPHATE 10 MG/ML IJ SOLN
INTRAMUSCULAR | Status: DC | PRN
  Administered 2016-01-07: 10 mg via INTRAVENOUS

## 2016-01-07 MED ORDER — HEMOSTATIC AGENTS (NO CHARGE) OPTIME
TOPICAL | Status: DC | PRN
  Administered 2016-01-07: 1 via TOPICAL

## 2016-01-07 MED ORDER — ACETAMINOPHEN 325 MG PO TABS: 650.0000 mg | ORAL_TABLET | ORAL | Status: DC | PRN

## 2016-01-07 MED ORDER — FENTANYL CITRATE (PF) 250 MCG/5ML IJ SOLN
INTRAMUSCULAR | Status: AC
  Filled 2016-01-07: qty 5

## 2016-01-07 MED ORDER — TRAMADOL HCL 50 MG PO TABS: 100.0000 mg | ORAL_TABLET | Freq: Four times a day (QID) | ORAL | Status: DC | PRN

## 2016-01-07 MED ORDER — THROMBIN 5000 UNITS EX SOLR
OROMUCOSAL | Status: DC | PRN
  Administered 2016-01-07: 5 mL via TOPICAL

## 2016-01-07 MED ORDER — CITALOPRAM HYDROBROMIDE 20 MG PO TABS
20.0000 mg | ORAL_TABLET | Freq: Every day | ORAL | Status: DC
  Administered 2016-01-08: 20 mg via ORAL
  Filled 2016-01-07: qty 1

## 2016-01-07 MED ORDER — MAGNESIUM CITRATE PO SOLN
1.0000 | Freq: Once | ORAL | Status: DC | PRN
Start: 2016-01-07 — End: 2016-01-08

## 2016-01-07 MED ORDER — MIDAZOLAM HCL 2 MG/2ML IJ SOLN
INTRAMUSCULAR | Status: AC
  Filled 2016-01-07: qty 2

## 2016-01-07 MED ORDER — SODIUM CHLORIDE 0.9 % IR SOLN
Status: DC | PRN
  Administered 2016-01-07: 500 mL

## 2016-01-07 MED ORDER — POLYETHYLENE GLYCOL 3350 17 G PO PACK: 17.0000 g | PACK | Freq: Every day | ORAL | Status: DC | PRN

## 2016-01-07 MED ORDER — ONDANSETRON HCL 4 MG/2ML IJ SOLN
INTRAMUSCULAR | Status: DC | PRN
  Administered 2016-01-07: 4 mg via INTRAVENOUS

## 2016-01-07 MED ORDER — SUGAMMADEX SODIUM 200 MG/2ML IV SOLN
INTRAVENOUS | Status: AC
  Filled 2016-01-07: qty 2

## 2016-01-07 MED ORDER — KETOROLAC TROMETHAMINE 15 MG/ML IJ SOLN
INTRAMUSCULAR | Status: AC
  Filled 2016-01-07: qty 1

## 2016-01-07 SURGICAL SUPPLY — 59 items
BAG DECANTER FOR FLEXI CONT (MISCELLANEOUS) ×3 IMPLANT
BLADE CLIPPER SURG (BLADE) IMPLANT
BUR ACORN 6.0 (BURR) IMPLANT
BUR ACORN 6.0MM (BURR)
BUR MATCHSTICK NEURO 3.0 LAGG (BURR) ×3 IMPLANT
CANISTER SUCT 3000ML PPV (MISCELLANEOUS) ×3 IMPLANT
DECANTER SPIKE VIAL GLASS SM (MISCELLANEOUS) ×3 IMPLANT
DERMABOND ADHESIVE PROPEN (GAUZE/BANDAGES/DRESSINGS) ×2
DERMABOND ADVANCED (GAUZE/BANDAGES/DRESSINGS) ×2
DERMABOND ADVANCED .7 DNX12 (GAUZE/BANDAGES/DRESSINGS) ×1 IMPLANT
DERMABOND ADVANCED .7 DNX6 (GAUZE/BANDAGES/DRESSINGS) ×1 IMPLANT
DRAPE LAPAROTOMY T 102X78X121 (DRAPES) ×3 IMPLANT
DRAPE MICROSCOPE LEICA (MISCELLANEOUS) IMPLANT
DRAPE POUCH INSTRU U-SHP 10X18 (DRAPES) ×3 IMPLANT
DRAPE PROXIMA HALF (DRAPES) ×3 IMPLANT
DRSG OPSITE POSTOP 4X6 (GAUZE/BANDAGES/DRESSINGS) ×3 IMPLANT
DURAPREP 26ML APPLICATOR (WOUND CARE) ×3 IMPLANT
ELECT REM PT RETURN 9FT ADLT (ELECTROSURGICAL) ×3
ELECTRODE REM PT RTRN 9FT ADLT (ELECTROSURGICAL) ×1 IMPLANT
GAUZE SPONGE 4X4 12PLY STRL (GAUZE/BANDAGES/DRESSINGS) IMPLANT
GAUZE SPONGE 4X4 16PLY XRAY LF (GAUZE/BANDAGES/DRESSINGS) IMPLANT
GLOVE BIOGEL PI IND STRL 6.5 (GLOVE) ×2 IMPLANT
GLOVE BIOGEL PI IND STRL 7.5 (GLOVE) ×1 IMPLANT
GLOVE BIOGEL PI IND STRL 8 (GLOVE) ×2 IMPLANT
GLOVE BIOGEL PI IND STRL 8.5 (GLOVE) ×1 IMPLANT
GLOVE BIOGEL PI INDICATOR 6.5 (GLOVE) ×4
GLOVE BIOGEL PI INDICATOR 7.5 (GLOVE) ×2
GLOVE BIOGEL PI INDICATOR 8 (GLOVE) ×4
GLOVE BIOGEL PI INDICATOR 8.5 (GLOVE) ×2
GLOVE ECLIPSE 7.0 STRL STRAW (GLOVE) ×3 IMPLANT
GLOVE ECLIPSE 7.5 STRL STRAW (GLOVE) ×9 IMPLANT
GLOVE ECLIPSE 8.5 STRL (GLOVE) ×3 IMPLANT
GLOVE EXAM NITRILE LRG STRL (GLOVE) IMPLANT
GLOVE EXAM NITRILE MD LF STRL (GLOVE) IMPLANT
GLOVE EXAM NITRILE XL STR (GLOVE) IMPLANT
GLOVE EXAM NITRILE XS STR PU (GLOVE) IMPLANT
GLOVE SURG SS PI 6.5 STRL IVOR (GLOVE) ×3 IMPLANT
GOWN STRL REUS W/ TWL LRG LVL3 (GOWN DISPOSABLE) ×2 IMPLANT
GOWN STRL REUS W/ TWL XL LVL3 (GOWN DISPOSABLE) IMPLANT
GOWN STRL REUS W/TWL 2XL LVL3 (GOWN DISPOSABLE) ×6 IMPLANT
GOWN STRL REUS W/TWL LRG LVL3 (GOWN DISPOSABLE) ×4
GOWN STRL REUS W/TWL XL LVL3 (GOWN DISPOSABLE)
KIT BASIN OR (CUSTOM PROCEDURE TRAY) ×3 IMPLANT
KIT ROOM TURNOVER OR (KITS) ×3 IMPLANT
NEEDLE HYPO 22GX1.5 SAFETY (NEEDLE) ×3 IMPLANT
NEEDLE SPNL 20GX3.5 QUINCKE YW (NEEDLE) ×3 IMPLANT
NS IRRIG 1000ML POUR BTL (IV SOLUTION) ×3 IMPLANT
PACK LAMINECTOMY NEURO (CUSTOM PROCEDURE TRAY) ×3 IMPLANT
PAD ARMBOARD 7.5X6 YLW CONV (MISCELLANEOUS) ×21 IMPLANT
PATTIES SURGICAL .5 X1 (DISPOSABLE) IMPLANT
RUBBERBAND STERILE (MISCELLANEOUS) IMPLANT
SPONGE SURGIFOAM ABS GEL SZ50 (HEMOSTASIS) ×3 IMPLANT
SUT VIC AB 1 CT1 18XBRD ANBCTR (SUTURE) ×1 IMPLANT
SUT VIC AB 1 CT1 8-18 (SUTURE) ×2
SUT VIC AB 2-0 CP2 18 (SUTURE) ×3 IMPLANT
SUT VIC AB 3-0 SH 8-18 (SUTURE) ×6 IMPLANT
TOWEL OR 17X24 6PK STRL BLUE (TOWEL DISPOSABLE) ×3 IMPLANT
TOWEL OR 17X26 10 PK STRL BLUE (TOWEL DISPOSABLE) ×3 IMPLANT
WATER STERILE IRR 1000ML POUR (IV SOLUTION) ×3 IMPLANT

## 2016-01-07 NOTE — Transfer of Care (Signed)
Immediate Anesthesia Transfer of Care Note  Patient: Natalia LeatherwoodLynn P Gwilliam  Procedure(s) Performed: Procedure(s): DECOMPRESSIONL LUMBAR THREE-FOUR  WITH FORAMINOTOMIES  (Bilateral)  Patient Location: PACU  Anesthesia Type:General  Level of Consciousness: awake, alert  and oriented  Airway & Oxygen Therapy: Patient Spontanous Breathing and Patient connected to nasal cannula oxygen  Post-op Assessment: Report given to RN, Post -op Vital signs reviewed and stable and Patient moving all extremities X 4  Post vital signs: Reviewed and stable  Last Vitals:  Filed Vitals:   01/07/16 1005  BP: 106/70  Pulse: 72  Temp: 36.7 C  Resp: 20    Last Pain:  Filed Vitals:   01/07/16 1423  PainSc: 5          Complications: No apparent anesthesia complications

## 2016-01-07 NOTE — Anesthesia Postprocedure Evaluation (Signed)
Anesthesia Post Note  Patient: Angela Cook  Procedure(s) Performed: Procedure(s) (LRB): DECOMPRESSIONL LUMBAR THREE-FOUR  WITH FORAMINOTOMIES  (Bilateral)  Patient location during evaluation: PACU Anesthesia Type: General Level of consciousness: awake and alert Pain management: pain level controlled Vital Signs Assessment: post-procedure vital signs reviewed and stable Respiratory status: spontaneous breathing, nonlabored ventilation, respiratory function stable and patient connected to nasal cannula oxygen Cardiovascular status: blood pressure returned to baseline and stable Postop Assessment: no signs of nausea or vomiting Anesthetic complications: no    Last Vitals:  Filed Vitals:   01/07/16 1545 01/07/16 1606  BP:  111/76  Pulse: 76 88  Temp: 36 C 36.3 C  Resp: 13 16    Last Pain:  Filed Vitals:   01/07/16 1626  PainSc: 2                  Cecile HearingStephen Edward Tolulope Pinkett

## 2016-01-07 NOTE — Anesthesia Preprocedure Evaluation (Addendum)
Anesthesia Evaluation  Patient identified by MRN, date of birth, ID band Patient awake    Reviewed: Allergy & Precautions, NPO status , Patient's Chart, lab work & pertinent test results  Airway Mallampati: II  TM Distance: >3 FB Neck ROM: Full    Dental  (+) Teeth Intact, Dental Advisory Given   Pulmonary neg pulmonary ROS,    Pulmonary exam normal breath sounds clear to auscultation       Cardiovascular Exercise Tolerance: Good negative cardio ROS Normal cardiovascular exam Rhythm:Regular Rate:Normal     Neuro/Psych PSYCHIATRIC DISORDERS Anxiety Depression Lumbar stenosis with neurogenic claudication    GI/Hepatic negative GI ROS, Neg liver ROS,   Endo/Other  negative endocrine ROS  Renal/GU negative Renal ROS     Musculoskeletal  (+) Arthritis , Osteoarthritis,  Fibromyalgia -, narcotic dependent  Abdominal   Peds  Hematology negative hematology ROS (+)   Anesthesia Other Findings Day of surgery medications reviewed with the patient.  Reproductive/Obstetrics                           Anesthesia Physical Anesthesia Plan  ASA: II  Anesthesia Plan: General   Post-op Pain Management:    Induction: Intravenous  Airway Management Planned: Oral ETT  Additional Equipment:   Intra-op Plan:   Post-operative Plan: Extubation in OR  Informed Consent: I have reviewed the patients History and Physical, chart, labs and discussed the procedure including the risks, benefits and alternatives for the proposed anesthesia with the patient or authorized representative who has indicated his/her understanding and acceptance.   Dental advisory given  Plan Discussed with: CRNA  Anesthesia Plan Comments: (Risks/benefits of general anesthesia discussed with patient including risk of damage to teeth, lips, gum, and tongue, nausea/vomiting, allergic reactions to medications, and the possibility of  heart attack, stroke and death.  All patient questions answered.  Patient wishes to proceed.)        Anesthesia Quick Evaluation

## 2016-01-07 NOTE — H&P (Signed)
Angela Cook is an 60 y.o. female.   Chief Complaint: Back and right greater than left leg pain. HPI: Patient is 60 year old lady who a number of years ago had a decompression and fusion at L4-L5. She had some extrusion of her posterior lumbar interbody spacer. That surgery had been done by Dr. Herbert Deaner in [place]. I revised her posterior interbody fusion she had good relief for a couple of years however she is now developed 60 increasing right leg pain and has what appears to be adjacent level stenosis right greater than left at L3-L4. There is no instability and I advised decompression. I've also advised that the patient will be a good candidate for Coflex procedure however this was declined by her insurance company. She is now to undergo bilateral laminotomies and foraminotomies at the level of L3-L4.  Past Medical History  Diagnosis Date  . Pneumonia     as a child  . Arthritis   . Fibromyalgia   . Anxiety   . Depression     takes Celexa for  fibromyalgia     Past Surgical History  Procedure Laterality Date  . Colonoscopy    . Cesarean section  [phone]  . Tubal ligation  1986  . Carpal tunnel release Right 1986    left 1991  . Bunionectomy Left 2014  . Back surgery  07/05/13, 2015    lumbar 4-5, 2015- new bone graft inserted    History reviewed. No pertinent family history. Social History:  reports that she has never smoked. She has never used smokeless tobacco. She reports that she does not drink alcohol or use illicit drugs.  Allergies:  Allergies  Allergen Reactions  . Duloxetine Hcl Other (See Comments)    Lost sense of taste    Medications Prior to Admission  Medication Sig Dispense Refill  . Cholecalciferol (VITAMIN D3) 3000 units TABS Take 3,000 Units by mouth daily.    . citalopram (CELEXA) 40 MG tablet Take 20 mg by mouth daily before breakfast.     . cyclobenzaprine (FLEXERIL) 10 MG tablet Take 5 mg by mouth at bedtime as needed for muscle spasms.     .  Glucosamine-Fish Oil-EPA-DHA 500-400-60-40 MG CAPS Take 1 capsule by mouth daily.    . Multiple Vitamin (MULTIVITAMIN) tablet Take 1 tablet by mouth daily.    . traMADol (ULTRAM) 50 MG tablet Take 100 mg by mouth every 6 (six) hours as needed.     . traZODone (DESYREL) 50 MG tablet Take 25 mg by mouth at bedtime.       No results found for this or any previous visit (from the past 48 hour(s)). No results found.  Review of Systems  Constitutional: Negative.   HENT: Negative.   Eyes: Negative.   Respiratory: Negative.   Cardiovascular: Negative.   Gastrointestinal: Negative.   Genitourinary: Negative.   Musculoskeletal: Positive for back pain.  Skin: Negative.   Neurological: Positive for tingling and focal weakness.  Endo/Heme/Allergies: Negative.   Psychiatric/Behavioral: Negative.     Blood pressure 106/70, pulse 72, temperature 98.1 F (36.7 C), resp. rate 20, height 5' 1.25" (1.556 m), weight 72.576 kg (160 lb), SpO2 98 %. Physical Exam  Constitutional: She is oriented to person, place, and time. She appears well-developed and well-nourished.  HENT:  Head: Normocephalic and atraumatic.  Eyes: Conjunctivae and EOM are normal. Pupils are equal, round, and reactive to light.  Neck: Normal range of motion. Neck supple.  Cardiovascular: Normal rate and regular rhythm.  Respiratory: Effort normal and breath sounds normal.  GI: Soft. Bowel sounds are normal.  Musculoskeletal:  Positive straight leg raising at 15 on the left side 30 on the right side. Patrick's maneuver is negative bilaterally. Palpation and percussion is tender in the lumbosacral junction.  Neurological: She is alert and oriented to person, place, and time.  Positive for weakness in the right tibialis anterior 4 minus out of 5. Strength otherwise intact. Deep tendon reflexes are absent in the patellae and the Achilles both.  Skin: Skin is warm and dry.  Psychiatric: She has a normal mood and affect. Her  behavior is normal. Judgment and thought content normal.     Assessment/Plan Lumbar spinal stenosis L3-L4, status post decompression arthrodesis L4-L5.  Plan: Bilateral laminotomies and foraminotomies L3-L4.  Stefani DamaELSNER,Angela Capes J, MD 01/07/2016, 12:14 PM

## 2016-01-07 NOTE — Op Note (Signed)
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 Status post decompression arthrodesis L4-L5 for spondylolisthesis Postoperative diagnosis: Same Procedure: Bilateral laminotomies and foraminotomies L3-L4 with decompression of L3 and L4 nerve roots. Surgeon: Barnett AbuHenry Shiryl Ruddy First assistant: Lisbeth RenshawNeelesh Nundkumar M.D. Anesthesia: Gen. endotracheal Indications: Myrlene BrokerLynn Schloss is a 60 year old individual who's had previous surgery to decompress and stabilize L4-L5. She has severe right lumbar radiculopathy secondary to one of her interbody grafting displaced and I had revised that a couple of years ago. She had persistent and worsening back pain and leg pain and a recent MRI demonstrates that she is evolve a severe stenosis at the level of L3-L4 above her fusion. She is now being admitted to undergo surgical decompression at L3-L4 with bilateral laminotomies and foraminotomies. No fusion will be performed.  Procedure: The patient was brought to the operating room supine on a stretcher. After the smooth induction of general endotracheal anesthesia, she was carefully turned prone and the bony prominences were appropriately padded and protected. The back was prepped with alcohol and DuraPrep and draped in a sterile fashion. The superior portion of her previously made incision was incised and dissection was carried down to the lumbar dorsal fascia. Fascia was opened on either side of midline to expose the spinous process of what was identified as L3 over the L3-4 disc space on the radiograph. The dissection was then carried down to expose the facet joints at L3-4 along the interlaminar space at L3-L4. Self-retaining retractor was carefully placed in the wound at this level. Then laminotomies were created first on the right side then on the left side removing the inferior margin lamina out to and including a partial mesial  facetectomy. The yellow ligament was thickened and redundant in this region and it was taken up thus allowing a good central canal decompression for  stenosis there. Next the lateral recesses were decompressed and the area of the disc was palpated and subarticular stenosis for the L4 nerve root inferiorly was decompressed. Care was taken to use a curved Kerrison punch to allow for good decompression of the lateral recess superiorly for the L3 nerve root. Once this was accomplished on the right side the same procedure was carried out on the left. Again the L3 and the L4 nerve roots could easily be sounded out the foramen. Procedure was done with the help of Dr. Matilde Bashnone Nunudkumar who retracted and dissected with a small rongeur to facilitate the surgery. In the N irrigated the wound copiously with antibiotic ear getting solution and closed lumbar dorsal fascia with #1 Vicryl in interrupted fashion 20 mL of half percent Marcaine were injected into the paraspinous fascia. Subcutaneous cutaneous tissue exclude with 2-0 Vicryl and 3-0 Vicryl was used to close subcuticular skin. Dermabond was placed on the skin. Blood loss was estimated at approximately 75 mL.

## 2016-01-07 NOTE — Anesthesia Procedure Notes (Signed)
Procedure Name: Intubation Date/Time: 01/07/2016 12:44 PM Performed by: Lanell MatarBAKER, Dewayne Jurek M Pre-anesthesia Checklist: Patient identified, Emergency Drugs available, Suction available and Patient being monitored Patient Re-evaluated:Patient Re-evaluated prior to inductionOxygen Delivery Method: Circle System Utilized Preoxygenation: Pre-oxygenation with 100% oxygen Intubation Type: IV induction Ventilation: Mask ventilation without difficulty Laryngoscope Size: Miller and 2 Grade View: Grade I Tube type: Oral Tube size: 7.0 mm Number of attempts: 1 Airway Equipment and Method: Stylet and Oral airway Placement Confirmation: ETT inserted through vocal cords under direct vision,  positive ETCO2 and breath sounds checked- equal and bilateral Secured at: 21 cm Tube secured with: Tape Dental Injury: Teeth and Oropharynx as per pre-operative assessment

## 2016-01-07 NOTE — Progress Notes (Signed)
Patient ID: Angela LeatherwoodLynn P Nesbitt, female   DOB: 03-26-1956, 60 y.o.   MRN: [id number] I'll signs are stable Alert and oriented postop No significant complaints of pain

## 2016-01-08 ENCOUNTER — Encounter (HOSPITAL_COMMUNITY): Payer: Self-pay | Admitting: Neurological Surgery

## 2016-01-08 DIAGNOSIS — M4806 Spinal stenosis, lumbar region: Secondary | ICD-10-CM | POA: Diagnosis not present

## 2016-01-08 MED ORDER — OXYCODONE-ACETAMINOPHEN 5-325 MG PO TABS: 1.0000 | ORAL_TABLET | ORAL | Status: DC | PRN

## 2016-01-08 MED ORDER — CYCLOBENZAPRINE HCL 5 MG PO TABS: 5.0000 mg | ORAL_TABLET | Freq: Every evening | ORAL | Status: AC | PRN

## 2016-01-08 NOTE — Evaluation (Signed)
Physical Therapy Evaluation and Discharge Patient Details Name: Angela Cook MRN: [id number] DOB: 06-24-1956 Today's Date: 01/08/2016   History of Present Illness  Pt is a 60 y/o female who presents s/p L3-L4 lumbar laminectomy on 01/07/16.  Clinical Impression  Patient evaluated by Physical Therapy with no further acute PT needs identified. All education has been completed and the patient has no further questions. At the time of PT eval pt was able to demonstrate transfers and ambulation with modified independence. Pt has been observed ambulating independently around unit after PT eval. See below for any follow-up Physical Therapy or equipment needs. PT is signing off. Thank you for this referral.     Follow Up Recommendations No PT follow up;Supervision - Intermittent    Equipment Recommendations  3in1 (PT)    Recommendations for Other Services       Precautions / Restrictions Precautions Precautions: Back Precaution Booklet Issued: Yes (comment) Precaution Comments: Reviewed handout and pt was cued for precautions during functional mobility.  Restrictions Weight Bearing Restrictions: No      Mobility  Bed Mobility Overal bed mobility: Modified Independent                Transfers Overall transfer level: Modified independent Equipment used: None                Ambulation/Gait Ambulation/Gait assistance: Modified independent (Device/Increase time) Ambulation Distance (Feet): 400 Feet Assistive device: None Gait Pattern/deviations: WFL(Within Functional Limits)   Gait velocity interpretation: at or above normal speed for age/gender    Stairs Stairs: Yes Stairs assistance: Modified independent (Device/Increase time) Stair Management: One rail Right;Alternating pattern;Forwards Number of Stairs: 5    Wheelchair Mobility    Modified Rankin (Stroke Patients Only)       Balance Overall balance assessment: No apparent balance deficits (not formally  assessed)                                           Pertinent Vitals/Pain Pain Assessment: No/denies pain    Home Living Family/patient expects to be discharged to:: Private residence Living Arrangements: Spouse/significant other Available Help at Discharge: Family;Available 24 hours/day Type of Home: House Home Access: Stairs to enter Entrance Stairs-Rails: None Entrance Stairs-Number of Steps: 2 Home Layout: One level Home Equipment: None      Prior Function Level of Independence: Independent               Hand Dominance   Dominant Hand: Right    Extremity/Trunk Assessment   Upper Extremity Assessment: Defer to OT evaluation           Lower Extremity Assessment: Overall WFL for tasks assessed      Cervical / Trunk Assessment: Normal  Communication   Communication: No difficulties  Cognition Arousal/Alertness: Awake/alert Behavior During Therapy: WFL for tasks assessed/performed Overall Cognitive Status: Within Functional Limits for tasks assessed                      General Comments      Exercises        Assessment/Plan    PT Assessment Patent does not need any further PT services  PT Diagnosis Acute pain   PT Problem List    PT Treatment Interventions     PT Goals (Current goals can be found in the Care Plan section) Acute Rehab PT Goals Patient  Stated Goal: to go home and get back to work PT Goal Formulation: All assessment and education complete, DC therapy    Frequency     Barriers to discharge        Co-evaluation               End of Session   Activity Tolerance: Patient tolerated treatment well Patient left: in bed;with call bell/phone within reach Nurse Communication: Mobility status    Functional Assessment Tool Used: Clinical judgement Functional Limitation: Mobility: Walking and moving around Mobility: Walking and Moving Around Current Status (V7846(G8978): At least 1 percent but less than  20 percent impaired, limited or restricted Mobility: Walking and Moving Around Goal Status [phone](G8979): At least 1 percent but less than 20 percent impaired, limited or restricted Mobility: Walking and Moving Around Discharge Status [phone](G8980): At least 1 percent but less than 20 percent impaired, limited or restricted    Time: 0733-0752 PT Time Calculation (min) (ACUTE ONLY): 19 min   Charges:   PT Evaluation $PT Eval Moderate Complexity: 1 Procedure     PT G Codes:   PT G-Codes **NOT FOR INPATIENT CLASS** Functional Assessment Tool Used: Clinical judgement Functional Limitation: Mobility: Walking and moving around Mobility: Walking and Moving Around Current Status (K4401(G8978): At least 1 percent but less than 20 percent impaired, limited or restricted Mobility: Walking and Moving Around Goal Status [phone](G8979): At least 1 percent but less than 20 percent impaired, limited or restricted Mobility: Walking and Moving Around Discharge Status [phone](G8980): At least 1 percent but less than 20 percent impaired, limited or restricted    Conni SlipperKirkman, Haralambos Yeatts 01/08/2016, 9:22 AM   Conni SlipperLaura Shedric Fredericks, PT, DPT Acute Rehabilitation Services Pager: [phone]

## 2016-01-08 NOTE — Progress Notes (Signed)
Occupational Therapy Evaluation Patient Details Name: Angela Cook MRN: [id number] DOB: April 08, 1956 Today's Date: 01/08/2016    History of Present Illness DECOMPRESSIONL LUMBAR THREE-FOUR WITH FORAMINOTOMIES (Bilateral)   Clinical Impression   Completed all education regarding back precautons, home safety and reducing risk of falls. Pt able to return demonstrate. Pt safe to D/C home with intermittent S when medically stable.     Follow Up Recommendations  No OT follow up;Supervision - Intermittent    Equipment Recommendations  3 in 1 bedside comode    Recommendations for Other Services       Precautions / Restrictions Precautions Precautions: Back Precaution Booklet Issued: Yes (comment)      Mobility Bed Mobility Overal bed mobility: Modified Independent                Transfers Overall transfer level: Modified independent                    Balance Overall balance assessment: No apparent balance deficits (not formally assessed)                                          ADL Overall ADL's : Needs assistance/impaired                                     Functional mobility during ADLs: Modified independent General ADL Comments: Completed education regaridng comepsnatory techniques for ADL and funcitonal mobility for ADL. Pt able to cross feet over knees for LB ADL. Educated on hygiene after toileting and compensatory techniques.  REcommended pt se reacher for items on floor. Educated on proper set up of ADL to maintain back precautions. . Also educatted on reducing risk of falls. REcommended pt use 3 in1 as shower chair.      Vision     Perception     Praxis      Pertinent Vitals/Pain Pain Assessment: No/denies pain     Hand Dominance     Extremity/Trunk Assessment Upper Extremity Assessment Upper Extremity Assessment: Overall WFL for tasks assessed   Lower Extremity Assessment Lower Extremity Assessment:  Defer to PT evaluation   Cervical / Trunk Assessment Cervical / Trunk Assessment: Normal   Communication     Cognition Arousal/Alertness: Awake/alert Behavior During Therapy: WFL for tasks assessed/performed Overall Cognitive Status: Within Functional Limits for tasks assessed                     General Comments       Exercises       Shoulder Instructions      Home Living Family/patient expects to be discharged to:: Private residence Living Arrangements: Spouse/significant other Available Help at Discharge: Family;Available 24 hours/day Type of Home: House             Bathroom Shower/Tub: Tub/shower unit;Curtain Shower/tub characteristics: Engineer, building services: Standard Bathroom Accessibility: Yes How Accessible: Accessible via walker Home Equipment: None          Prior Functioning/Environment Level of Independence: Independent             OT Diagnosis: Generalized weakness;Acute pain   OT Problem List: Decreased strength;Decreased activity tolerance;Decreased knowledge of use of DME or AE;Decreased knowledge of precautions;Pain   OT Treatment/Interventions:      OT Goals(Current goals can be  found in the care plan section) Acute Rehab OT Goals Patient Stated Goal: to go home and get back to work OT Goal Formulation: All assessment and education complete, DC therapy  OT Frequency:     Barriers to D/C:            Co-evaluation              End of Session Nurse Communication: Mobility status  Activity Tolerance: Patient tolerated treatment well Patient left: in bed;with call bell/phone within reach   Time: [id number] OT Time Calculation (min): 13 min Charges:  OT General Charges $OT Visit: 1 Procedure OT Evaluation $OT Eval Low Complexity: 1 Procedure G-Codes: OT G-codes **NOT FOR INPATIENT CLASS** Functional Assessment Tool Used: clinical judgement Functional Limitation: Self care Self Care Current Status (G9562(G8987): At  least 1 percent but less than 20 percent impaired, limited or restricted Self Care Goal Status (Z3086(G8988): At least 1 percent but less than 20 percent impaired, limited or restricted Self Care Discharge Status [phone](G8989): At least 1 percent but less than 20 percent impaired, limited or restricted  Angela Cook,Angela Cook 01/08/2016, 9:04 AM   Sunrise Canyonilary Suhail Cook, OTR/L  [phone] 01/08/2016

## 2016-01-08 NOTE — Discharge Summary (Signed)
Physician Discharge Summary  Patient ID: Angela LeatherwoodLynn P Kain MRN: [id number] DOB/AGE: [birth year] 62 y.o.  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 She is ambulatory. She is discharged home this morning  Consults: None  Significant Diagnostic Studies: None  Treatments: surgery: Bilateral laminotomies and foraminotomies L3-4.  Discharge Exam: Blood pressure 97/55, pulse 68, temperature 98.2 F (36.8 C), temperature source Oral, resp. rate 18, height 5' 1.25" (1.556 m), weight 72.576 kg (160 lb), SpO2 97 %. Incision is clean and dry, motor function is intact.  Disposition: 01-Home or Self Care  Discharge Instructions    Call MD for:  redness, tenderness, or signs of infection (pain, swelling, redness, odor or green/yellow discharge around incision site)    Complete by:  As directed      Call MD for:  severe uncontrolled pain    Complete by:  As directed      Call MD for:  temperature >100.4    Complete by:  As directed      Diet - low sodium heart healthy    Complete by:  As directed      Discharge instructions    Complete by:  As directed   Okay to shower. Do not apply salves or appointments to incision. No heavy lifting with the upper extremities greater than 15 pounds. May resume driving when not requiring pain medication and patient feels comfortable with doing so.     Increase activity slowly    Complete by:  As directed             Medication List    TAKE these medications        citalopram 40 MG tablet  Commonly known  as:  CELEXA  Take 20 mg by mouth daily before breakfast.     cyclobenzaprine 10 MG tablet  Commonly known as:  FLEXERIL  Take 5 mg by mouth at bedtime as needed for muscle spasms.     cyclobenzaprine 5 MG tablet  Commonly known as:  FLEXERIL  Take 1 tablet (5 mg total) by mouth at bedtime as needed for muscle spasms.     Glucosamine-Fish Oil-EPA-DHA 500-400-60-40 MG Caps  Take 1 capsule by mouth daily.     multivitamin tablet  Take 1 tablet by mouth daily.     oxyCODONE-acetaminophen 5-325 MG tablet  Commonly known as:  PERCOCET/ROXICET  Take 1-2 tablets by mouth every 4 (four) hours as needed for moderate pain.     traMADol 50 MG tablet  Commonly known as:  ULTRAM  Take 100 mg by mouth every 6 (six) hours as needed.     traZODone 50 MG tablet  Commonly known as:  DESYREL  Take 25 mg by mouth at bedtime.     Vitamin D3 3000 units Tabs  Take 3,000 Units by mouth daily.         SignedStefani Dama: Jenine Krisher J 01/08/2016, 9:05 AM

## 2016-01-08 NOTE — Progress Notes (Signed)
Patient alert and oriented, mae's well, voiding adequate amount of urine, swallowing without difficulty, no c/o pain. Patient discharged home with family. Script and discharged instructions given to patient. Patient and family stated understanding of d/c instructions given and has an appointment with MD. 

## 2016-02-24 ENCOUNTER — Other Ambulatory Visit: Payer: Self-pay | Admitting: Neurological Surgery

## 2016-02-24 DIAGNOSIS — M5416 Radiculopathy, lumbar region: Secondary | ICD-10-CM

## 2016-03-05 ENCOUNTER — Ambulatory Visit
Admission: RE | Admit: 2016-03-05 | Discharge: 2016-03-05 | Disposition: A | Discharge status: Home / Self Care | Payer: Managed Care, Other (non HMO) | Source: Ambulatory Visit | Attending: Neurological Surgery | Admitting: Neurological Surgery

## 2016-03-05 DIAGNOSIS — M5416 Radiculopathy, lumbar region: Secondary | ICD-10-CM

## 2016-03-05 MED ORDER — GADOBENATE DIMEGLUMINE 529 MG/ML IV SOLN
15.0000 mL | Freq: Once | INTRAVENOUS | Status: AC | PRN
  Administered 2016-03-05: 15 mL via INTRAVENOUS

## 2016-05-26 ENCOUNTER — Other Ambulatory Visit (HOSPITAL_BASED_OUTPATIENT_CLINIC_OR_DEPARTMENT_OTHER): Payer: Self-pay | Admitting: Family Medicine

## 2016-05-26 DIAGNOSIS — Z1231 Encounter for screening mammogram for malignant neoplasm of breast: Secondary | ICD-10-CM

## 2016-05-28 ENCOUNTER — Ambulatory Visit (HOSPITAL_BASED_OUTPATIENT_CLINIC_OR_DEPARTMENT_OTHER)
Admission: RE | Admit: 2016-05-28 | Discharge: 2016-05-28 | Disposition: A | Discharge status: Home / Self Care | Payer: Managed Care, Other (non HMO) | Source: Ambulatory Visit | Attending: Family Medicine | Admitting: Family Medicine

## 2016-05-28 DIAGNOSIS — Z1231 Encounter for screening mammogram for malignant neoplasm of breast: Secondary | ICD-10-CM | POA: Diagnosis present

## 2017-02-16 ENCOUNTER — Emergency Department (HOSPITAL_BASED_OUTPATIENT_CLINIC_OR_DEPARTMENT_OTHER)
Admission: EM | Admit: 2017-02-16 | Discharge: 2017-02-16 | Disposition: A | Discharge status: Home / Self Care | Payer: 59 | Attending: Emergency Medicine | Admitting: Emergency Medicine

## 2017-02-16 ENCOUNTER — Encounter (HOSPITAL_BASED_OUTPATIENT_CLINIC_OR_DEPARTMENT_OTHER): Payer: Self-pay

## 2017-02-16 DIAGNOSIS — Z79899 Other long term (current) drug therapy: Secondary | ICD-10-CM | POA: Insufficient documentation

## 2017-02-16 DIAGNOSIS — W57XXXA Bitten or stung by nonvenomous insect and other nonvenomous arthropods, initial encounter: Secondary | ICD-10-CM | POA: Diagnosis not present

## 2017-02-16 DIAGNOSIS — R519 Headache, unspecified: Secondary | ICD-10-CM

## 2017-02-16 DIAGNOSIS — R51 Headache: Secondary | ICD-10-CM | POA: Diagnosis not present

## 2017-02-16 NOTE — Discharge Instructions (Signed)
Continue applying steroid cream to the area. Follow-up with your primary care physician for reevaluation. Return to the ED if any concerning signs or symptoms develop such as worsening redness, fluctuance, drainage, ulceration. Drink plenty of fluids and get plenty of rest. Follow-up with your neurosurgeon as scheduled for reevaluation of your headache and other symptoms.

## 2017-02-16 NOTE — ED Provider Notes (Signed)
[facility] Provider Note   CSN: [id number] Arrival date & time: 02/16/17  2047  By signing my name below, I, Thelma Bargeick Cochran, attest that this documentation has been prepared under the direction and in the presence of [facility] System[facility]ina Keyontae Huckeby, PA-C. Electronically Signed: Thelma BargeNick Cochran, Scribe. 02/16/17. 10:12 PM.  History   Chief Complaint Chief Complaint  Patient presents with  . Insect Bite   The history is provided by the patient. No language interpreter was used.    HPI Comments: Angela Cook is a 61 y.o. female with PMHx of chronic back pain and fibromyalgia and PSHx of multiple spinal fusions and spine surrgeries who presents to the Emergency Department complaining of an acute onset, constant color change to her left arm that occurred 1-2 days ago. Pt suspects it was an insect bite as [place], but she did not observe a bite. She has tried using cortisone with mild relief of itching. She denies any pain, abdominal pain, fevers, chills, and numbness/tingling. Pt reports having a recent procedure yesterday of placement of a spinal cord stimulator and was concerned her insect bite might cause complications with her surgery.   Pt also complains of a constant, gradual onset, throbbing 6/10 headache on her temples since yesterday after her spinal cord stimulator was placed.She denies vision changes, photophobia, or phonophobia. HA is not positional and no aggravating/alleviating factors were noted. Has taken 1g tylenol yesterday with mild relief. She states her HA might also be due to being NPO and not having caffeine because of her recent surgery yesterday, and having reduced water intake.  Past Medical History:  Diagnosis Date  . Anxiety   . Arthritis   . Pneumonia    as a child    Patient Active Problem List   Diagnosis Date Noted  . Lumbar stenosis with neurogenic claudication 01/07/2016  . 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 BACK SURGERY  07/05/13, 2015   lumbar 4-5, 2015- new bone graft inserted  . BUNIONECTOMY Left 2014  . CARPAL TUNNEL RELEASE Right 1986   left 1991  . CESAREAN SECTION  [phone]  . COLONOSCOPY    . LUMBAR LAMINECTOMY/DECOMPRESSION MICRODISCECTOMY Bilateral 01/07/2016   Procedure: DECOMPRESSIONL LUMBAR THREE-FOUR  WITH FORAMINOTOMIES ;  Surgeon: Barnett AbuHenry Elsner, MD;  Location: [facility];  Service: Neurosurgery;  Laterality: Bilateral;  . TUBAL LIGATION  1986    OB History    No data available       Home Medications    Prior to Admission medications   Medication Sig Start Date End Date Taking? Authorizing Provider  Cholecalciferol (VITAMIN D3) 3000 units TABS Take 3,000 Units by mouth daily.    [provider]  citalopram (CELEXA) 40 MG tablet Take 20 mg by mouth daily before breakfast.     [provider]  cyclobenzaprine (FLEXERIL) 10 MG tablet Take 5 mg by mouth at bedtime as needed for muscle spasms.  10/22/15   [provider]  cyclobenzaprine (FLEXERIL) 5 MG tablet Take 1 tablet (5 mg total) by mouth at bedtime as needed for muscle spasms. 01/08/16   Barnett AbuElsner, Henry, MD  Glucosamine-Fish Oil-EPA-DHA 500-400-60-40 MG CAPS Take 1 capsule by mouth daily.    [provider]  Multiple Vitamin (MULTIVITAMIN) tablet Take 1 tablet by mouth daily.    [provider]  oxyCODONE-acetaminophen (PERCOCET/ROXICET) 5-325 MG tablet Take 1-2 tablets by mouth every 4 (four) hours as needed for  moderate pain. 01/08/16   Barnett Abu, MD  traMADol (ULTRAM) 50 MG tablet Take 100 mg by mouth every 6 (six) hours as needed.     [provider]  traZODone (DESYREL) 50 MG tablet Take 25 mg by mouth at bedtime.     [provider]    Family History No family history on file.  Social History Social History  Substance Use Topics  . Smoking status: Never Smoker  . Smokeless tobacco: Never Used  . Alcohol use No      Comment: rare- 0nce per week     Allergies   Duloxetine hcl   Review of Systems Review of Systems  Constitutional: Negative for chills and fever.  Eyes: Negative for photophobia and visual disturbance.  Gastrointestinal: Negative for abdominal pain.  Skin: Positive for color change and rash.  Neurological: Positive for headaches. Negative for numbness.     Physical Exam Updated Vital Signs BP (!) 147/92 (BP Location: Left Arm)   Pulse 98   Temp 97.8 F (36.6 C) (Oral)   Resp 18   Ht 5' (1.524 m)   Wt 72.6 kg (160 lb)   SpO2 100%   BMI 31.25 kg/m   Physical Exam  Constitutional: She is oriented to person, place, and time. She appears well-developed and well-nourished. No distress.  HENT:  Head: Normocephalic and atraumatic.  Right Ear: External ear normal.  Left Ear: External ear normal.  Mouth/Throat: Oropharynx is clear and moist.  No Battle's signs, no raccoon's eyes, no rhinorrhea.No tenderness to palpation of the face or skull. No deformity, crepitus, or swelling noted.   Eyes: Pupils are equal, round, and reactive to light. Conjunctivae and EOM are normal. Right eye exhibits no discharge. Left eye exhibits no discharge.  Neck: Normal range of motion. Neck supple. No JVD present. No tracheal deviation present.  No midline spine TTP, no paraspinal muscle tenderness, no deformity, crepitus, or step-off noted   Cardiovascular: Normal rate, regular rhythm, normal heart sounds and intact distal pulses.   Pulmonary/Chest: Effort normal and breath sounds normal. No respiratory distress. She has no wheezes. She has no rales.  Abdominal: Soft. Bowel sounds are normal. She exhibits no distension. There is no tenderness.  Musculoskeletal: She exhibits no edema.  Moves extremities spontaneously with 5/5 strength of BUE and BLE major muscle groups. Spinal cord trial stimulator in place with no signs of surrounding erythema or drainage.  Neurological: She is alert and oriented  to person, place, and time. No cranial nerve deficit.  Fluent speech, no facial droop, normal gait, cranial nerves III through XII tested and intact. No pronator drift.  Skin: Skin is warm and dry. No erythema.  1.5 cm circular area of erythema to the underside of the left upper arm. There appear to be 2 puncture bites centrally. No fluctuance, induration, or drainage or bleeding noted.  Psychiatric: She has a normal mood and affect. Her behavior is normal.  Nursing note and vitals reviewed.    ED Treatments / Results  DIAGNOSTIC STUDIES: Oxygen Saturation is 100% on RA, normal by my interpretation.    COORDINATION OF CARE: 10:12 PM Discussed treatment plan with pt at bedside and pt agreed to plan.  Labs (all labs ordered are listed, but only abnormal results are displayed) Labs Reviewed - No data to display  EKG  EKG Interpretation None       Radiology No results found.  Procedures Procedures (including critical care time)  Medications Ordered in ED Medications -  No data to display   Initial Impression / Assessment and Plan / ED Course  I have reviewed the triage vital signs and the nursing notes.  Pertinent labs & imaging results that were available during my care of the patient were reviewed by me and considered in my medical decision making (see chart for details).     Patient presents with concern for insect bite and bitemporal headache since yesterday. Afebrile, vital signs are stable. No focal neurological deficits. In the absence of fever or nuchal rigidity, doubt meningitis. Headache is not positional, doubt spinal cord trauma or CSF leak. Doubt ICH, SAH, or epidural bleed given gradual onset and no confusion, vomiting, slurred speech, or syncope. No red flag symptoms concerning for cauda equina syndrome. Offered patient fluids and migraine cocktail without NSAIDs, and she declines at this time and states that she would feel more comfortable treating this at home.  I feel this is reasonable. The rash to her left arm appears to be consistent with localized skin irritation secondary to insect bite. No evidence of tissue necrosis or overlying cellulitis. Encouraged patient to keep the area clean and dry and continue using steroid cream for itching as needed. Discussed signs or symptoms concerning for spread of infection. She will follow-up with her primary care physician and neurologist as scheduled for reevaluation. Discussed indications for immediate return to the ED.  Pt verbalized understanding of and agreement with plan and is safe for discharge home at this time.  Final Clinical Impressions(s) / ED Diagnoses   Final diagnoses:  Insect bite, initial encounter  Bad headache    New Prescriptions Discharge Medication List as of 02/16/2017 10:19 PM    I personally performed the services described in this documentation, which was scribed in my presence. The recorded information has been reviewed and is accurate.    Jeanie SewerFawze, Nacole Fluhr A, PA-C 02/17/17 1415    Arby BarrettePfeiffer, Marcy, MD 02/17/17 [phone]

## 2017-02-16 NOTE — ED Triage Notes (Signed)
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

## 2017-02-16 NOTE — ED Notes (Signed)
ED Provider at bedside. 

## 2017-03-23 ENCOUNTER — Other Ambulatory Visit: Payer: Self-pay | Admitting: Physician Assistant

## 2017-03-23 DIAGNOSIS — M546 Pain in thoracic spine: Secondary | ICD-10-CM

## 2017-04-03 ENCOUNTER — Other Ambulatory Visit: Payer: 59

## 2017-04-19 ENCOUNTER — Ambulatory Visit
Admission: RE | Admit: 2017-04-19 | Discharge: 2017-04-19 | Disposition: A | Discharge status: Home / Self Care | Payer: 59 | Source: Ambulatory Visit | Attending: Physician Assistant | Admitting: Physician Assistant

## 2017-04-19 DIAGNOSIS — M546 Pain in thoracic spine: Secondary | ICD-10-CM

## 2017-06-08 ENCOUNTER — Other Ambulatory Visit (HOSPITAL_BASED_OUTPATIENT_CLINIC_OR_DEPARTMENT_OTHER): Payer: Self-pay | Admitting: Family Medicine

## 2017-06-08 DIAGNOSIS — Z1231 Encounter for screening mammogram for malignant neoplasm of breast: Secondary | ICD-10-CM

## 2017-06-23 ENCOUNTER — Ambulatory Visit (HOSPITAL_BASED_OUTPATIENT_CLINIC_OR_DEPARTMENT_OTHER)
Admission: RE | Admit: 2017-06-23 | Discharge: 2017-06-23 | Disposition: A | Discharge status: Home / Self Care | Payer: 59 | Source: Ambulatory Visit | Attending: Family Medicine | Admitting: Family Medicine

## 2017-06-23 DIAGNOSIS — Z1231 Encounter for screening mammogram for malignant neoplasm of breast: Secondary | ICD-10-CM | POA: Diagnosis present

## 2018-02-01 ENCOUNTER — Emergency Department (HOSPITAL_BASED_OUTPATIENT_CLINIC_OR_DEPARTMENT_OTHER)
Admission: EM | Admit: 2018-02-01 | Discharge: 2018-02-01 | Disposition: A | Discharge status: Home / Self Care | Payer: 59 | Attending: Emergency Medicine | Admitting: Emergency Medicine

## 2018-02-01 ENCOUNTER — Other Ambulatory Visit: Payer: Self-pay

## 2018-02-01 ENCOUNTER — Encounter (HOSPITAL_BASED_OUTPATIENT_CLINIC_OR_DEPARTMENT_OTHER): Payer: Self-pay | Admitting: Emergency Medicine

## 2018-02-01 ENCOUNTER — Emergency Department (HOSPITAL_BASED_OUTPATIENT_CLINIC_OR_DEPARTMENT_OTHER): Payer: 59

## 2018-02-01 DIAGNOSIS — Y999 Unspecified external cause status: Secondary | ICD-10-CM | POA: Insufficient documentation

## 2018-02-01 DIAGNOSIS — J45909 Unspecified asthma, uncomplicated: Secondary | ICD-10-CM | POA: Diagnosis not present

## 2018-02-01 DIAGNOSIS — W101XXA Fall (on)(from) sidewalk curb, initial encounter: Secondary | ICD-10-CM | POA: Insufficient documentation

## 2018-02-01 DIAGNOSIS — S82842A Displaced bimalleolar fracture of left lower leg, initial encounter for closed fracture: Secondary | ICD-10-CM | POA: Diagnosis not present

## 2018-02-01 DIAGNOSIS — Y9389 Activity, other specified: Secondary | ICD-10-CM | POA: Insufficient documentation

## 2018-02-01 DIAGNOSIS — Z79899 Other long term (current) drug therapy: Secondary | ICD-10-CM | POA: Diagnosis not present

## 2018-02-01 DIAGNOSIS — W19XXXA Unspecified fall, initial encounter: Secondary | ICD-10-CM

## 2018-02-01 DIAGNOSIS — Y929 Unspecified place or not applicable: Secondary | ICD-10-CM | POA: Diagnosis not present

## 2018-02-01 DIAGNOSIS — S99912A Unspecified injury of left ankle, initial encounter: Secondary | ICD-10-CM | POA: Diagnosis present

## 2018-02-01 HISTORY — DX: Unspecified asthma, uncomplicated: J45.909

## 2018-02-01 MED ORDER — LIDOCAINE-EPINEPHRINE 1 %-1:100000 IJ SOLN
INTRAMUSCULAR | Status: AC
  Administered 2018-02-01: 20 mL
  Filled 2018-02-01: qty 1

## 2018-02-01 MED ORDER — KETOROLAC TROMETHAMINE 15 MG/ML IJ SOLN
15.0000 mg | Freq: Once | INTRAMUSCULAR | Status: AC
  Administered 2018-02-01: 15 mg via INTRAVENOUS
  Filled 2018-02-01: qty 1

## 2018-02-01 MED ORDER — FENTANYL CITRATE (PF) 100 MCG/2ML IJ SOLN
100.0000 ug | Freq: Once | INTRAMUSCULAR | Status: DC
  Filled 2018-02-01: qty 2

## 2018-02-01 MED ORDER — LIDOCAINE-EPINEPHRINE (PF) 2 %-1:200000 IJ SOLN
INTRAMUSCULAR | Status: AC
  Filled 2018-02-01: qty 20

## 2018-02-01 MED ORDER — LIDOCAINE-EPINEPHRINE 2 %-1:100000 IJ SOLN
20.0000 mL | Freq: Once | INTRAMUSCULAR | Status: DC
  Filled 2018-02-01: qty 20

## 2018-02-01 MED ORDER — FENTANYL CITRATE (PF) 100 MCG/2ML IJ SOLN
50.0000 ug | Freq: Once | INTRAMUSCULAR | Status: AC
  Administered 2018-02-01: 50 ug via INTRAVENOUS
  Filled 2018-02-01: qty 2

## 2018-02-01 MED ORDER — ACETAMINOPHEN 500 MG PO TABS
1000.0000 mg | ORAL_TABLET | Freq: Once | ORAL | Status: AC
  Administered 2018-02-01: 1000 mg via ORAL
  Filled 2018-02-01: qty 2

## 2018-02-01 MED ORDER — MORPHINE SULFATE 15 MG PO TABS: 15.0000 mg | ORAL_TABLET | ORAL | 0 refills | Status: AC | PRN

## 2018-02-01 MED FILL — MORPHINE SULFATE IR 15 MG T: 15 | 2 days supply | Qty: 7 | Fill #0

## 2018-02-01 NOTE — ED Notes (Signed)
ED Provider at bedside. 

## 2018-02-01 NOTE — Discharge Instructions (Addendum)
Follow up with ortho.  Do not bear weight to the ankle.  Keep the ankle above the level of your heart as often as you can to try and keep the swelling down.  Take 4 over the counter ibuprofen tablets 3 times a day or 2 over-the-counter naproxen tablets twice a day for pain. Also take tylenol 1000mg (2 extra strength) four times a day. Then take the pain medicine if you need it.

## 2018-02-01 NOTE — ED Notes (Signed)
Short leg with stirrups applied while Dr. Adela LankFloyd held in place, Patient states splint feels good after application, Capillary refill appropriate in toes after application.

## 2018-02-01 NOTE — ED Notes (Signed)
ED Provider at bedside for ankle manipulation.

## 2018-02-01 NOTE — ED Triage Notes (Signed)
Pt fell in wet grass and sts her left ankle hit the curb.  Swelling, deformity, and pain. CMS intact to toes.  Ice pack in place.

## 2018-02-01 NOTE — ED Provider Notes (Signed)
[facility] EMERGENCY DEPARTMENT Provider Note   CSN: [id number] Arrival date & time: 02/01/18  0850     History   Chief Complaint Chief Complaint  Patient presents with  . Ankle Injury    HPI Angela Cook is a 62 y.o. female.  62 yo F with a chief complaint of left ankle pain.  The patient was cutting through a grassy area on the way to work and she stepped off the curb and inverted her ankle.  She collapsed to the ground, denies any other injury.  She tried to lift her ankle when she was on the ground and felt that it went in a different direction that she had moved it.  She applied ice with some minimal improvement.  She denies pain to the knee.  The history is provided by the patient.  Ankle Injury  This is a new problem. The current episode started less than 1 hour ago. The problem occurs constantly. The problem has been gradually worsening. Pertinent negatives include no chest pain, no headaches and no shortness of breath. The symptoms are aggravated by bending and twisting. The symptoms are relieved by ice. She has tried nothing for the symptoms. The treatment provided no relief.    Past Medical History:  Diagnosis Date  . Anxiety   . Arthritis   . Asthma   . Fibromyalgia   . Pneumonia    as a child    Patient Active Problem List   Diagnosis Date Noted  . Lumbar stenosis with neurogenic claudication 01/07/2016  . Lumbar pseudoarthrosis 07/06/2014    Past Surgical History:  Procedure Laterality Date  . BACK SURGERY  07/05/13, 2015   lumbar 4-5, 2015- new bone graft inserted  . BUNIONECTOMY Left 2014  . CARPAL TUNNEL RELEASE Right 1986   left 1991  . CESAREAN SECTION  [id number]  . COLONOSCOPY    . LUMBAR LAMINECTOMY/DECOMPRESSION MICRODISCECTOMY Bilateral 01/07/2016   Procedure: DECOMPRESSIONL LUMBAR THREE-FOUR  WITH FORAMINOTOMIES ;  Surgeon: Barnett AbuHenry Elsner, MD;  Location: [facility];  Service: Neurosurgery;  Laterality: Bilateral;  . SPINAL  CORD STIMULATOR IMPLANT    . TUBAL LIGATION  1986     OB History   None      Home Medications    Prior to Admission medications   Medication Sig Start Date End Date Taking? Authorizing Provider  citalopram (CELEXA) 40 MG tablet Take 20 mg by mouth daily before breakfast.    Yes [provider]  cyclobenzaprine (FLEXERIL) 10 MG tablet Take 5 mg by mouth at bedtime as needed for muscle spasms.  10/22/15  Yes [provider]  DULoxetine (CYMBALTA) 30 MG capsule Take 30 mg by mouth daily.   Yes [provider]  ibuprofen (ADVIL,MOTRIN) 800 MG tablet Take 800 mg by mouth daily.   Yes [provider]  traZODone (DESYREL) 50 MG tablet Take 25 mg by mouth at bedtime.    Yes [provider]  Cholecalciferol (VITAMIN D3) 3000 units TABS Take 3,000 Units by mouth daily.    [provider]  cyclobenzaprine (FLEXERIL) 5 MG tablet Take 1 tablet (5 mg total) by mouth at bedtime as needed for muscle spasms. 01/08/16   Barnett AbuElsner, Henry, MD  Glucosamine-Fish Oil-EPA-DHA 500-400-60-40 MG CAPS Take 1 capsule by mouth daily.    [provider]  morphine (MSIR) 15 MG tablet Take 1 tablet (15 mg total) by mouth every 4 (four) hours as needed for severe pain. 02/01/18   [name], [name],  DO  Multiple Vitamin (MULTIVITAMIN) tablet Take 1 tablet by mouth daily.    [provider]  oxyCODONE-acetaminophen (PERCOCET/ROXICET) 5-325 MG tablet Take 1-2 tablets by mouth every 4 (four) hours as needed for moderate pain. 01/08/16   Barnett Abu, MD  traMADol (ULTRAM) 50 MG tablet Take 100 mg by mouth every 6 (six) hours as needed.     [provider]    Family History No family history on file.  Social History Social History   Tobacco Use  . Smoking status: Never Smoker  . Smokeless tobacco: Never Used  Substance Use Topics  . Alcohol use: No    Frequency: Never  . Drug use: No     Allergies   Duloxetine hcl   Review of Systems Review  of Systems  Constitutional: Negative for chills and fever.  HENT: Negative for congestion and rhinorrhea.   Eyes: Negative for redness and visual disturbance.  Respiratory: Negative for shortness of breath and wheezing.   Cardiovascular: Negative for chest pain and palpitations.  Gastrointestinal: Negative for nausea and vomiting.  Genitourinary: Negative for dysuria and urgency.  Musculoskeletal: Positive for arthralgias and joint swelling. Negative for myalgias.  Skin: Negative for pallor and wound.  Neurological: Negative for dizziness and headaches.     Physical Exam Updated Vital Signs BP 110/63 (BP Location: Right Arm)   Pulse 69   Temp 97.9 F (36.6 C) (Oral)   Resp 18   Ht 5\' 1"  (1.549 m)   Wt 68 kg (150 lb)   SpO2 97%   BMI 28.34 kg/m   Physical Exam  Constitutional: She is oriented to person, place, and time. She appears well-developed and well-nourished. No distress.  HENT:  Head: Normocephalic and atraumatic.  Eyes: Pupils are equal, round, and reactive to light. EOM are normal.  Neck: Normal range of motion. Neck supple.  Cardiovascular: Normal rate and regular rhythm. Exam reveals no gallop and no friction rub.  No murmur heard. Pulmonary/Chest: Effort normal. She has no wheezes. She has no rales.  Abdominal: Soft. She exhibits no distension. There is no tenderness.  Musculoskeletal: She exhibits edema, tenderness and deformity.  Significant edema to the left ankle.  Pulse motor and sensation is intact.  Neurological: She is alert and oriented to person, place, and time.  Skin: Skin is warm and dry. She is not diaphoretic.  Psychiatric: She has a normal mood and affect. Her behavior is normal.  Nursing note and vitals reviewed.    ED Treatments / Results  Labs (all labs ordered are listed, but only abnormal results are displayed) Labs Reviewed - No data to display  EKG None  Radiology Dg Ankle 2 Views Left  Result Date: 02/01/2018 CLINICAL DATA:   Bimalleolar left ankle fractures. EXAM: LEFT ANKLE - 2 VIEW COMPARISON:  Radiographs dated 02/01/2018 FINDINGS: The patient has undergone closed reduction. There has been elimination of the angulation and marked improvement in the lateral subluxation of the foot and malleoli with respect of the distal tibial and fibular shafts. There is still some lateral subluxation. IMPRESSION: Improved alignment and position of the bimalleolar ankle fractures. Electronically Signed   By: Francene Boyers M.D.   On: 02/01/2018 11:08   Dg Ankle 2 Views Left  Result Date: 02/01/2018 CLINICAL DATA:  Pain following fall EXAM: LEFT ANKLE - 2 VIEW COMPARISON:  None. FINDINGS: Frontal and lateral views were obtained. There is gross ankle mortise dislocation with the talar dome located well lateral to the tibial plafond. There  is a comminuted fracture of the medial malleolus with lateral displacement medial malleolus compared to the remainder the tibia. The medial malleolus is inferior to the mid tibial plafond. There is a comminuted fracture of distal fibular diaphysis with lateral displacement and angulation of the distal major fracture fragment with respect to the proximal fragment. There is 3 cm of overriding of fracture fragments in this area. There are is a prominent inferior calcaneal spur with a small posterior calcaneal spur. IMPRESSION: Gross ankle mortise disruption. Comminuted fractures of the medial malleolus and distal tibia with displacement and angulation of major fracture fragments. Prominent inferior calcaneal spur noted. Electronically Signed   By: Bretta Bang III M.D.   On: 02/01/2018 09:39    Procedures Reduction of dislocation Date/Time: 02/01/2018 11:03 AM Performed by: Melene Plan, DO Authorized by: Melene Plan, DO  Consent: Verbal consent obtained. Risks and benefits: risks, benefits and alternatives were discussed Consent given by: patient Patient understanding: patient states understanding of the  procedure being performed Patient consent: the patient's understanding of the procedure matches consent given Procedure consent: procedure consent matches procedure scheduled Imaging studies: imaging studies available Patient identity confirmed: verbally with patient Time out: Immediately prior to procedure a "time out" was called to verify the correct patient, procedure, equipment, support staff and site/side marked as required. Local anesthesia used: yes Anesthesia: see MAR for details  Anesthesia: Local anesthesia used: yes  Sedation: Patient sedated: no  Patient tolerance: Patient tolerated the procedure well with no immediate complications  .Joint Aspiration/Arthrocentesis Date/Time: 02/01/2018 11:04 AM Performed by: Melene Plan, DO Authorized by: Melene Plan, DO   Consent:    Consent obtained:  Verbal   Consent given by:  Patient   Risks discussed:  Bleeding, infection and pain   Alternatives discussed:  No treatment, delayed treatment and alternative treatment Location:    Location:  Ankle   Ankle:  L ankle Anesthesia (see MAR for exact dosages):    Anesthesia method:  Local infiltration   Local anesthetic:  Lidocaine 1% WITH epi Procedure details:    Preparation: Patient was prepped and draped in usual sterile fashion     Needle gauge: 21G.   Ultrasound guidance: no     Approach:  Anterior   Aspirate characteristics:  Blood-tinged   Steroid injected: no     Specimen collected: no   Post-procedure details:    Dressing:  Adhesive bandage   Patient tolerance of procedure:  Tolerated well, no immediate complications   (including critical care time)  SPLINT APPLICATION Date/Time: 11:17 AM Authorized by: Rae Roam Consent: Verbal consent obtained. Risks and benefits: risks, benefits and alternatives were discussed Consent given by: patient Splint applied by: ED tech Location details: left ankle Splint type: posterior and stirrup Supplies used: ortho  glass Post-procedure: The splinted body part was neurovascularly unchanged following the procedure. Patient tolerance: Patient tolerated the procedure well with no immediate complications.     Medications Ordered in ED Medications  lidocaine-EPINEPHrine (XYLOCAINE W/EPI) 2 %-1:100000 (with pres) injection 20 mL (has no administration in time range)  fentaNYL (SUBLIMAZE) injection 100 mcg (100 mcg Intravenous Not Given 02/01/18 1114)  lidocaine-EPINEPHrine (XYLOCAINE W/EPI) 2 %-1:200000 (PF) injection (has no administration in time range)  acetaminophen (TYLENOL) tablet 1,000 mg (1,000 mg Oral Given 02/01/18 0934)  fentaNYL (SUBLIMAZE) injection 50 mcg (50 mcg Intravenous Given 02/01/18 0936)  ketorolac (TORADOL) 15 MG/ML injection 15 mg (15 mg Intravenous Given 02/01/18 0934)  lidocaine-EPINEPHrine (XYLOCAINE W/EPI) 1 %-1:100000 (with pres) injection (  20 mLs  Given 02/01/18 0959)     Initial Impression / Assessment and Plan / ED Course  I have reviewed the triage vital signs and the nursing notes.  Pertinent labs & imaging results that were available during my care of the patient were reviewed by me and considered in my medical decision making (see chart for details).     62 yo F with a chief complaint of left ankle pain.  The patient was cutting through the grass on her way to work and she lost her balance when she stepped over a curb and inverted her ankle.  Complaining of pain to the ankle.  Denies any other injury denies head injury loss consciousness chest pain back pain abdominal pain.  Significant swelling to the left ankle.  Pulse motor and sensation is intact.  Will obtain a plain film.  Fracture with lateral dislocation.  Local injection to the ankle with improvement of pain.  Reduced at bedside, splinted.  Will rexray.   Repeat x-ray on my view with still some widening to the medial aspect of the joint.  There is significant improvement of alignment.  During the reduction I felt  that I had manipulated it fully to the medial aspect, I wonder if there is a bony fragment giving me from reducing it.  At this point will not take down the splint we will have her follow-up with Ortho.   11:17 AM:  I have discussed the diagnosis/risks/treatment options with the patient and family and believe the pt to be eligible for discharge home to follow-up with Ortho. We also discussed returning to the ED immediately if new or worsening sx occur. We discussed the sx which are most concerning (e.g., sudden worsening pain, fever, inability to tolerate by mouth) that necessitate immediate return. Medications administered to the patient during their visit and any new prescriptions provided to the patient are listed below.  Medications given during this visit Medications  lidocaine-EPINEPHrine (XYLOCAINE W/EPI) 2 %-1:100000 (with pres) injection 20 mL (has no administration in time range)  fentaNYL (SUBLIMAZE) injection 100 mcg (100 mcg Intravenous Not Given 02/01/18 1114)  lidocaine-EPINEPHrine (XYLOCAINE W/EPI) 2 %-1:200000 (PF) injection (has no administration in time range)  acetaminophen (TYLENOL) tablet 1,000 mg (1,000 mg Oral Given 02/01/18 0934)  fentaNYL (SUBLIMAZE) injection 50 mcg (50 mcg Intravenous Given 02/01/18 0936)  ketorolac (TORADOL) 15 MG/ML injection 15 mg (15 mg Intravenous Given 02/01/18 0934)  lidocaine-EPINEPHrine (XYLOCAINE W/EPI) 1 %-1:100000 (with pres) injection (20 mLs  Given 02/01/18 0959)      The patient appears reasonably screen and/or stabilized for discharge and I doubt any other medical condition or other [facility] requiring further screening, evaluation, or treatment in the ED at this time prior to discharge.    Final Clinical Impressions(s) / ED Diagnoses   Final diagnoses:  Bimalleolar ankle fracture, left, closed, initial encounter    ED Discharge Orders        Ordered    morphine (MSIR) 15 MG tablet  Every 4 hours PRN     02/01/18 1103       Melene Plan, DO 02/01/18 1117

## 2018-02-01 NOTE — ED Triage Notes (Signed)
Per [facility] patient walking down hill and her left foot slid out from under her.  Per [facility] pt "has obvious left ankle fracture".

## 2018-02-01 NOTE — ED Notes (Signed)
Patient transported to X-ray 

## 2018-02-03 ENCOUNTER — Encounter (INDEPENDENT_AMBULATORY_CARE_PROVIDER_SITE_OTHER): Payer: Self-pay | Admitting: Orthopaedic Surgery

## 2018-02-03 ENCOUNTER — Other Ambulatory Visit (INDEPENDENT_AMBULATORY_CARE_PROVIDER_SITE_OTHER): Payer: Self-pay

## 2018-02-03 ENCOUNTER — Ambulatory Visit (INDEPENDENT_AMBULATORY_CARE_PROVIDER_SITE_OTHER): Payer: 59 | Admitting: Orthopaedic Surgery

## 2018-02-03 VITALS — Ht 61.0 in | Wt 150.0 lb

## 2018-02-03 DIAGNOSIS — S82842A Displaced bimalleolar fracture of left lower leg, initial encounter for closed fracture: Secondary | ICD-10-CM

## 2018-02-03 NOTE — Progress Notes (Signed)
Office Visit Note   Patient: Angela Cook           Date of Birth: 1955/10/09           MRN: [id number] Visit Date: 02/03/2018              Requested by: Deloris Pingyter-Brown, Sherry M, MD [address] [place], [place] [id number] PCP: Deloris Pingyter-Brown, Sherry M, MD   Assessment & Plan: Visit Diagnoses:  1. Bimalleolar ankle fracture, left, closed, initial encounter     Plan: Impression is unstable left bimalleolar ankle fracture dislocation.  Skin was checked today and this should be amenable for surgical fixation by early next week.  We discussed the surgery in detail including the risks and benefits.  Rehab and recovery discussed also.  Follow-up for 2-week postoperative visit.  Patient denies a history of DVT.  Follow-Up Instructions: Return in about 2 weeks (around 02/17/2018).   Orders:  No orders of the defined types were placed in this encounter.  No orders of the defined types were placed in this encounter.     Procedures: No procedures performed   Clinical Data: No additional findings.   Subjective: Chief Complaint  Patient presents with  . Left Ankle - Fracture    Patient is a 62 year old female who sustained a left ankle fracture dislocation 2 days ago from a mechanical fall.  She denies any loss consciousness or neck pain.  The ankle was reduced in the ED under local anesthesia and splinted.  She follows up today for further evaluation and treatment.  Denies any numbness and tingling.   Review of Systems  Constitutional: Negative.   HENT: Negative.   Eyes: Negative.   Respiratory: Negative.   Cardiovascular: Negative.   Endocrine: Negative.   Musculoskeletal: Negative.   Neurological: Negative.   Hematological: Negative.   Psychiatric/Behavioral: Negative.   All other systems reviewed and are negative.    Objective: Vital Signs: Ht 5\' 1"  (1.549 m)   Wt 150 lb (68 kg)   BMI 28.34 kg/m   Physical Exam  Constitutional: She is oriented to  person, place, and time. She appears well-developed and well-nourished.  HENT:  Head: Normocephalic and atraumatic.  Eyes: EOM are normal.  Neck: Neck supple.  Pulmonary/Chest: Effort normal.  Abdominal: Soft.  Neurological: She is alert and oriented to person, place, and time.  Skin: Skin is warm. Capillary refill takes less than 2 seconds.  Psychiatric: She has a normal mood and affect. Her behavior is normal. Judgment and thought content normal.  Nursing note and vitals reviewed.   Ortho Exam  Left ankle exam s shows mild swelling.  No open skin lesions.  Foot is warm well perfused.   specialty Comments:  No specialty comments available.  Imaging: No results found.   PMFS History: Patient Active Problem List   Diagnosis Date Noted  . Lumbar stenosis with neurogenic claudication 01/07/2016  . Lumbar pseudoarthrosis 07/06/2014   Past Medical History:  Diagnosis Date  . Anxiety   . Arthritis   . Asthma   . Fibromyalgia   . Pneumonia    as a child    No family history on file.  Past Surgical History:  Procedure Laterality Date  . BACK SURGERY  07/05/13, 2015   lumbar 4-5, 2015- new bone graft inserted  . BUNIONECTOMY Left 2014  . CARPAL TUNNEL RELEASE Right 1986   left 1991  . CESAREAN SECTION  [id number],1986  . COLONOSCOPY    . LUMBAR  LAMINECTOMY/DECOMPRESSION MICRODISCECTOMY Bilateral 01/07/2016   Procedure: DECOMPRESSIONL LUMBAR THREE-FOUR  WITH FORAMINOTOMIES ;  Surgeon: Barnett Abu, MD;  Location: [facility];  Service: Neurosurgery;  Laterality: Bilateral;  . SPINAL CORD STIMULATOR IMPLANT    . TUBAL LIGATION  1986   Social History   Occupational History  . Not on file  Tobacco Use  . Smoking status: Never Smoker  . Smokeless tobacco: Never Used  Substance and Sexual Activity  . Alcohol use: No    Frequency: Never  . Drug use: No  . Sexual activity: Not on file

## 2018-02-04 ENCOUNTER — Encounter (HOSPITAL_COMMUNITY): Payer: Self-pay | Admitting: *Deleted

## 2018-02-04 ENCOUNTER — Other Ambulatory Visit: Payer: Self-pay

## 2018-02-07 ENCOUNTER — Ambulatory Visit (HOSPITAL_COMMUNITY)
Admission: RE | Admit: 2018-02-07 | Discharge: 2018-02-07 | Disposition: A | Discharge status: Home / Self Care | Payer: 59 | Source: Ambulatory Visit | Attending: Orthopaedic Surgery | Admitting: Orthopaedic Surgery

## 2018-02-07 ENCOUNTER — Ambulatory Visit (HOSPITAL_COMMUNITY): Payer: 59 | Admitting: Anesthesiology

## 2018-02-07 ENCOUNTER — Encounter (HOSPITAL_COMMUNITY): Payer: Self-pay | Admitting: *Deleted

## 2018-02-07 ENCOUNTER — Encounter (HOSPITAL_COMMUNITY)
Admission: RE | Disposition: A | Discharge status: Home / Self Care | Payer: Self-pay | Source: Ambulatory Visit | Attending: Orthopaedic Surgery

## 2018-02-07 ENCOUNTER — Ambulatory Visit (HOSPITAL_COMMUNITY): Payer: 59

## 2018-02-07 DIAGNOSIS — M797 Fibromyalgia: Secondary | ICD-10-CM | POA: Diagnosis not present

## 2018-02-07 DIAGNOSIS — K5903 Drug induced constipation: Secondary | ICD-10-CM | POA: Insufficient documentation

## 2018-02-07 DIAGNOSIS — Z79899 Other long term (current) drug therapy: Secondary | ICD-10-CM | POA: Diagnosis not present

## 2018-02-07 DIAGNOSIS — Z419 Encounter for procedure for purposes other than remedying health state, unspecified: Secondary | ICD-10-CM

## 2018-02-07 DIAGNOSIS — X58XXXA Exposure to other specified factors, initial encounter: Secondary | ICD-10-CM | POA: Diagnosis not present

## 2018-02-07 DIAGNOSIS — Z79891 Long term (current) use of opiate analgesic: Secondary | ICD-10-CM | POA: Diagnosis not present

## 2018-02-07 DIAGNOSIS — M1990 Unspecified osteoarthritis, unspecified site: Secondary | ICD-10-CM | POA: Diagnosis not present

## 2018-02-07 DIAGNOSIS — S82852A Displaced trimalleolar fracture of left lower leg, initial encounter for closed fracture: Secondary | ICD-10-CM

## 2018-02-07 DIAGNOSIS — S82842A Displaced bimalleolar fracture of left lower leg, initial encounter for closed fracture: Secondary | ICD-10-CM | POA: Diagnosis present

## 2018-02-07 DIAGNOSIS — T402X5A Adverse effect of other opioids, initial encounter: Secondary | ICD-10-CM | POA: Diagnosis not present

## 2018-02-07 HISTORY — DX: Drug induced constipation: K59.03

## 2018-02-07 HISTORY — DX: Adverse effect of other opioids, initial encounter: T40.2X5A

## 2018-02-07 HISTORY — PX: ORIF ANKLE FRACTURE: SHX5408

## 2018-02-07 SURGERY — OPEN REDUCTION INTERNAL FIXATION (ORIF) ANKLE FRACTURE
Anesthesia: Regional | Site: Ankle | Laterality: Left

## 2018-02-07 MED ORDER — MIDAZOLAM HCL 2 MG/2ML IJ SOLN
2.0000 mg | Freq: Once | INTRAMUSCULAR | Status: AC
  Administered 2018-02-07: 2 mg via INTRAVENOUS

## 2018-02-07 MED ORDER — GLYCOPYRROLATE PF 0.2 MG/ML IJ SOSY
PREFILLED_SYRINGE | INTRAMUSCULAR | Status: DC | PRN
  Administered 2018-02-07: .2 mg via INTRAVENOUS

## 2018-02-07 MED ORDER — ROPIVACAINE HCL 5 MG/ML IJ SOLN
INTRAMUSCULAR | Status: DC | PRN
  Administered 2018-02-07: 30 mL via PERINEURAL

## 2018-02-07 MED ORDER — ZINC SULFATE 220 (50 ZN) MG PO CAPS: 220.0000 mg | ORAL_CAPSULE | Freq: Every day | ORAL | 0 refills | Status: AC

## 2018-02-07 MED ORDER — CHLORHEXIDINE GLUCONATE 4 % EX LIQD: 60.0000 mL | Freq: Once | CUTANEOUS | Status: DC

## 2018-02-07 MED ORDER — MIDAZOLAM HCL 2 MG/2ML IJ SOLN
INTRAMUSCULAR | Status: AC
Start: 2018-02-07 — End: 2018-02-07
  Administered 2018-02-07: 2 mg via INTRAVENOUS
  Filled 2018-02-07: qty 2

## 2018-02-07 MED ORDER — GABAPENTIN 300 MG PO CAPS
ORAL_CAPSULE | ORAL | Status: AC
  Filled 2018-02-07: qty 1

## 2018-02-07 MED ORDER — MEPERIDINE HCL 50 MG/ML IJ SOLN: 6.2500 mg | INTRAMUSCULAR | Status: DC | PRN

## 2018-02-07 MED ORDER — FENTANYL CITRATE (PF) 250 MCG/5ML IJ SOLN
INTRAMUSCULAR | Status: AC
  Filled 2018-02-07: qty 5

## 2018-02-07 MED ORDER — ONDANSETRON HCL 4 MG/2ML IJ SOLN
INTRAMUSCULAR | Status: DC | PRN
  Administered 2018-02-07: 4 mg via INTRAVENOUS

## 2018-02-07 MED ORDER — GLYCOPYRROLATE PF 0.2 MG/ML IJ SOSY
PREFILLED_SYRINGE | INTRAMUSCULAR | Status: AC
  Filled 2018-02-07: qty 1

## 2018-02-07 MED ORDER — SODIUM CHLORIDE 0.9 % IV SOLN
INTRAVENOUS | Status: DC | PRN
  Administered 2018-02-07: 25 ug/min via INTRAVENOUS

## 2018-02-07 MED ORDER — DEXAMETHASONE SODIUM PHOSPHATE 10 MG/ML IJ SOLN
INTRAMUSCULAR | Status: DC | PRN
  Administered 2018-02-07: 4 mg via INTRAVENOUS

## 2018-02-07 MED ORDER — LIDOCAINE 2% (20 MG/ML) 5 ML SYRINGE
INTRAMUSCULAR | Status: AC
  Filled 2018-02-07: qty 5

## 2018-02-07 MED ORDER — DEXAMETHASONE SODIUM PHOSPHATE 10 MG/ML IJ SOLN
INTRAMUSCULAR | Status: AC
  Filled 2018-02-07: qty 1

## 2018-02-07 MED ORDER — LIDOCAINE 2% (20 MG/ML) 5 ML SYRINGE
INTRAMUSCULAR | Status: DC | PRN
  Administered 2018-02-07: 100 mg via INTRAVENOUS

## 2018-02-07 MED ORDER — PROPOFOL 10 MG/ML IV BOLUS
INTRAVENOUS | Status: AC
  Filled 2018-02-07: qty 20

## 2018-02-07 MED ORDER — OXYCODONE HCL ER 10 MG PO T12A: 10.0000 mg | EXTENDED_RELEASE_TABLET | Freq: Two times a day (BID) | ORAL | 0 refills | Status: AC

## 2018-02-07 MED ORDER — HYDROCODONE-ACETAMINOPHEN 7.5-325 MG PO TABS: 1.0000 | ORAL_TABLET | Freq: Once | ORAL | Status: DC | PRN

## 2018-02-07 MED ORDER — LACTATED RINGERS IV SOLN
INTRAVENOUS | Status: DC
  Administered 2018-02-07 (×2): via INTRAVENOUS

## 2018-02-07 MED ORDER — ACETAMINOPHEN 500 MG PO TABS
1000.0000 mg | ORAL_TABLET | Freq: Once | ORAL | Status: AC
  Administered 2018-02-07: 1000 mg via ORAL

## 2018-02-07 MED ORDER — PHENYLEPHRINE 40 MCG/ML (10ML) SYRINGE FOR IV PUSH (FOR BLOOD PRESSURE SUPPORT)
PREFILLED_SYRINGE | INTRAVENOUS | Status: DC | PRN
  Administered 2018-02-07 (×2): 80 ug via INTRAVENOUS

## 2018-02-07 MED ORDER — ACETAMINOPHEN 10 MG/ML IV SOLN: 1000.0000 mg | Freq: Once | INTRAVENOUS | Status: DC | PRN

## 2018-02-07 MED ORDER — CALCIUM CARBONATE-VITAMIN D 500-200 MG-UNIT PO TABS: 1.0000 | ORAL_TABLET | Freq: Three times a day (TID) | ORAL | 12 refills | Status: AC

## 2018-02-07 MED ORDER — PROMETHAZINE HCL 25 MG/ML IJ SOLN: 6.2500 mg | INTRAMUSCULAR | Status: DC | PRN

## 2018-02-07 MED ORDER — ONDANSETRON HCL 4 MG PO TABS: 4.0000 mg | ORAL_TABLET | Freq: Three times a day (TID) | ORAL | 0 refills | Status: AC | PRN

## 2018-02-07 MED ORDER — ACETAMINOPHEN 500 MG PO TABS
ORAL_TABLET | ORAL | Status: AC
  Filled 2018-02-07: qty 2

## 2018-02-07 MED ORDER — PROPOFOL 10 MG/ML IV BOLUS
INTRAVENOUS | Status: DC | PRN
  Administered 2018-02-07: 150 mg via INTRAVENOUS

## 2018-02-07 MED ORDER — FENTANYL CITRATE (PF) 100 MCG/2ML IJ SOLN
100.0000 ug | Freq: Once | INTRAMUSCULAR | Status: AC
  Administered 2018-02-07: 100 ug via INTRAVENOUS

## 2018-02-07 MED ORDER — OXYCODONE-ACETAMINOPHEN 5-325 MG PO TABS: 1.0000 | ORAL_TABLET | ORAL | 0 refills | Status: AC | PRN

## 2018-02-07 MED ORDER — ONDANSETRON HCL 4 MG/2ML IJ SOLN
INTRAMUSCULAR | Status: AC
  Filled 2018-02-07: qty 2

## 2018-02-07 MED ORDER — SENNOSIDES-DOCUSATE SODIUM 8.6-50 MG PO TABS: 1.0000 | ORAL_TABLET | Freq: Every evening | ORAL | 1 refills | Status: AC | PRN

## 2018-02-07 MED ORDER — PROMETHAZINE HCL 25 MG PO TABS: 25.0000 mg | ORAL_TABLET | Freq: Four times a day (QID) | ORAL | 1 refills | Status: AC | PRN

## 2018-02-07 MED ORDER — ASPIRIN EC 81 MG PO TBEC: 81.0000 mg | DELAYED_RELEASE_TABLET | Freq: Two times a day (BID) | ORAL | 0 refills | Status: AC

## 2018-02-07 MED ORDER — METHOCARBAMOL 750 MG PO TABS: 750.0000 mg | ORAL_TABLET | Freq: Two times a day (BID) | ORAL | 0 refills | Status: AC | PRN

## 2018-02-07 MED ORDER — CEFAZOLIN SODIUM-DEXTROSE 2-4 GM/100ML-% IV SOLN
2.0000 g | INTRAVENOUS | Status: AC
  Administered 2018-02-07: 2 g via INTRAVENOUS

## 2018-02-07 MED ORDER — HYDROMORPHONE HCL 1 MG/ML IJ SOLN: 0.2500 mg | INTRAMUSCULAR | Status: DC | PRN

## 2018-02-07 MED ORDER — BUPIVACAINE HCL (PF) 0.25 % IJ SOLN
INTRAMUSCULAR | Status: AC
  Filled 2018-02-07: qty 30

## 2018-02-07 MED ORDER — FENTANYL CITRATE (PF) 250 MCG/5ML IJ SOLN
INTRAMUSCULAR | Status: DC | PRN
  Administered 2018-02-07: 50 ug via INTRAVENOUS

## 2018-02-07 MED ORDER — CEFAZOLIN SODIUM-DEXTROSE 2-4 GM/100ML-% IV SOLN
INTRAVENOUS | Status: AC
  Filled 2018-02-07: qty 100

## 2018-02-07 MED ORDER — GABAPENTIN 300 MG PO CAPS
300.0000 mg | ORAL_CAPSULE | Freq: Once | ORAL | Status: AC
Start: 2018-02-07 — End: 2018-02-07
  Administered 2018-02-07: 300 mg via ORAL

## 2018-02-07 MED ORDER — FENTANYL CITRATE (PF) 100 MCG/2ML IJ SOLN
INTRAMUSCULAR | Status: AC
  Administered 2018-02-07: 100 ug via INTRAVENOUS
  Filled 2018-02-07: qty 2

## 2018-02-07 SURGICAL SUPPLY — 69 items
BANDAGE ACE 4X5 VEL STRL LF (GAUZE/BANDAGES/DRESSINGS) ×2 IMPLANT
BANDAGE ACE 6X5 VEL STRL LF (GAUZE/BANDAGES/DRESSINGS) IMPLANT
BANDAGE ESMARK 6X9 LF (GAUZE/BANDAGES/DRESSINGS) IMPLANT
BIT DRILL 2.7 QC CANN 155 (BIT) ×2 IMPLANT
BIT DRILL 2.7MM OVERBIT QC (BIT) ×1 IMPLANT
BIT DRILL QC 2.0 SHORT EVOS SM (DRILL) ×1 IMPLANT
BIT DRILL QC 2.5MM SHRT EVO SM (DRILL) ×1 IMPLANT
BLADE SURG 15 STRL LF DISP TIS (BLADE) ×1 IMPLANT
BLADE SURG 15 STRL SS (BLADE) ×1
BNDG COHESIVE 4X5 TAN STRL (GAUZE/BANDAGES/DRESSINGS) ×2 IMPLANT
BNDG ESMARK 6X9 LF (GAUZE/BANDAGES/DRESSINGS)
CANISTER SUCT 3000ML PPV (MISCELLANEOUS) ×2 IMPLANT
COVER SURGICAL LIGHT HANDLE (MISCELLANEOUS) ×2 IMPLANT
CUFF TOURNIQUET SINGLE 34IN LL (TOURNIQUET CUFF) IMPLANT
CUFF TOURNIQUET SINGLE 44IN (TOURNIQUET CUFF) IMPLANT
DRAPE C-ARM 42X72 X-RAY (DRAPES) ×2 IMPLANT
DRAPE C-ARMOR (DRAPES) ×2 IMPLANT
DRAPE INCISE IOBAN 66X45 STRL (DRAPES) ×2 IMPLANT
DRAPE U-SHAPE 47X51 STRL (DRAPES) ×2 IMPLANT
DRILL 2.7MM OVERDRILL QC (BIT) ×2
DRILL QC 2.0 SHORT EVOS SM (DRILL) ×2
DRILL QC 2.5MM SHORT EVOS SM (DRILL) ×2
DRSG PAD ABDOMINAL 8X10 ST (GAUZE/BANDAGES/DRESSINGS) ×2 IMPLANT
DURAPREP 26ML APPLICATOR (WOUND CARE) ×2 IMPLANT
ELECT CAUTERY BLADE 6.4 (BLADE) ×2 IMPLANT
ELECT REM PT RETURN 9FT ADLT (ELECTROSURGICAL) ×2
ELECTRODE REM PT RTRN 9FT ADLT (ELECTROSURGICAL) ×1 IMPLANT
GAUZE SPONGE 4X4 12PLY STRL (GAUZE/BANDAGES/DRESSINGS) ×2 IMPLANT
GAUZE SPONGE 4X4 12PLY STRL LF (GAUZE/BANDAGES/DRESSINGS) ×2 IMPLANT
GAUZE XEROFORM 5X9 LF (GAUZE/BANDAGES/DRESSINGS) ×2 IMPLANT
GLOVE BIOGEL PI IND STRL 7.0 (GLOVE) ×1 IMPLANT
GLOVE BIOGEL PI INDICATOR 7.0 (GLOVE) ×1
GLOVE ECLIPSE 7.0 STRL STRAW (GLOVE) ×2 IMPLANT
GLOVE SKINSENSE NS SZ7.5 (GLOVE) ×1
GLOVE SKINSENSE STRL SZ7.5 (GLOVE) ×1 IMPLANT
GLOVE SURG SYN 7.5  E (GLOVE) ×2
GLOVE SURG SYN 7.5 E (GLOVE) ×2 IMPLANT
GOWN STRL REIN XL XLG (GOWN DISPOSABLE) ×2 IMPLANT
GUIDE PIN 1.3 (PIN) ×4
KIT BASIN OR (CUSTOM PROCEDURE TRAY) ×2 IMPLANT
KIT TURNOVER KIT B (KITS) ×2 IMPLANT
NEEDLE HYPO 25GX1X1/2 BEV (NEEDLE) IMPLANT
NS IRRIG 1000ML POUR BTL (IV SOLUTION) ×2 IMPLANT
PACK ORTHO EXTREMITY (CUSTOM PROCEDURE TRAY) ×2 IMPLANT
PAD ARMBOARD 7.5X6 YLW CONV (MISCELLANEOUS) ×4 IMPLANT
PAD CAST 4YDX4 CTTN HI CHSV (CAST SUPPLIES) ×1 IMPLANT
PADDING CAST COTTON 4X4 STRL (CAST SUPPLIES) ×1
PADDING CAST COTTON 6X4 STRL (CAST SUPPLIES) ×2 IMPLANT
PIN GUIDE 1.3 (PIN) ×2 IMPLANT
PLATE 3.5 LOCK 7H 1/3 TUBULAR (Plate) ×2 IMPLANT
SCREW CANNULATED 4.0X35 (Screw) ×2 IMPLANT
SCREW CANNULATED 4.0X36 (Screw) ×2 IMPLANT
SCREW CORT 2.7X17 ST T8 EVOS (Screw) ×2 IMPLANT
SCREW CORT 3.5X11 ST EVOS (Screw) ×4 IMPLANT
SCREW CORT 3.5X14 ST EVOS (Screw) ×2 IMPLANT
SCREW CORT EVOS ST 3.5X12 (Screw) ×2 IMPLANT
SCREW LOCK ST EVOS 3.5X10 (Screw) ×2 IMPLANT
SCREW OST EVOS FT 4.7X16 (Screw) ×2 IMPLANT
SPLINT FIBERGLASS 4X30 (CAST SUPPLIES) ×2 IMPLANT
SUCTION FRAZIER HANDLE 10FR (MISCELLANEOUS) ×1
SUCTION TUBE FRAZIER 10FR DISP (MISCELLANEOUS) ×1 IMPLANT
SUT ETHILON 3 0 PS 1 (SUTURE) IMPLANT
SUT VIC AB 2-0 CT1 27 (SUTURE)
SUT VIC AB 2-0 CT1 TAPERPNT 27 (SUTURE) IMPLANT
SYR CONTROL 10ML LL (SYRINGE) IMPLANT
TOWEL OR 17X24 6PK STRL BLUE (TOWEL DISPOSABLE) ×2 IMPLANT
TOWEL OR 17X26 10 PK STRL BLUE (TOWEL DISPOSABLE) ×4 IMPLANT
TUBE CONNECTING 12X1/4 (SUCTIONS) ×2 IMPLANT
UNDERPAD 30X30 (UNDERPADS AND DIAPERS) ×2 IMPLANT

## 2018-02-07 NOTE — H&P (Signed)
PREOPERATIVE H&P  Chief Complaint: left bimalleolar ankle fracture  HPI: Angela Cook is a 62 y.o. female who presents for surgical treatment of left bimalleolar ankle fracture.  She denies any changes in medical history.  Past Medical History:  Diagnosis Date  . Arthritis   . Asthma    as a child, no issues since age 68  . Constipation due to opioid therapy   . Fibromyalgia    legs from back   . Pneumonia    as a child   Past Surgical History:  Procedure Laterality Date  . BACK SURGERY  07/05/13, 2015   lumbar 4-5, 2015- new bone graft inserted  . BUNIONECTOMY Left 2014  . CARPAL TUNNEL RELEASE Right 1986   left 1991  . CARPAL TUNNEL RELEASE Left   . CESAREAN SECTION  [phone]  . COLONOSCOPY    . LUMBAR LAMINECTOMY/DECOMPRESSION MICRODISCECTOMY Bilateral 01/07/2016   Procedure: DECOMPRESSIONL LUMBAR THREE-FOUR  WITH FORAMINOTOMIES ;  Surgeon: Barnett Abu, MD;  Location: [facility];  Service: Neurosurgery;  Laterality: Bilateral;  . SPINAL CORD STIMULATOR IMPLANT    . TUBAL LIGATION  1986   Social History   Socioeconomic History  . Marital status: Married    Spouse name: Not on file  . Number of children: Not on file  . Years of education: Not on file  . Highest education level: Not on file  Occupational History  . Not on file  Social Needs  . Financial resource strain: Not on file  . Food insecurity:    Worry: Not on file    Inability: Not on file  . Transportation needs:    Medical: Not on file    Non-medical: Not on file  Tobacco Use  . Smoking status: Never Smoker  . Smokeless tobacco: Never Used  Substance and Sexual Activity  . Alcohol use: No    Frequency: Never  . Drug use: No  . Sexual activity: Not on file  Lifestyle  . Physical activity:    Days per week: Not on file    Minutes per session: Not on file  . Stress: Not on file  Relationships  . Social connections:    Talks on phone: Not on file    Gets together: Not on file   Attends religious service: Not on file    Active member of club or organization: Not on file    Attends meetings of clubs or organizations: Not on file    Relationship status: Not on file  Other Topics Concern  . Not on file  Social History Narrative  . Not on file   History reviewed. No pertinent family history. No Active Allergies Prior to Admission medications   Medication Sig Start Date End Date Taking? Authorizing Provider  citalopram (CELEXA) 20 MG tablet Take 20 mg by mouth daily before breakfast.    Yes [provider]  cyclobenzaprine (FLEXERIL) 5 MG tablet Take 1 tablet (5 mg total) by mouth at bedtime as needed for muscle spasms. 01/08/16  Yes Barnett Abu, MD  DULoxetine (CYMBALTA) 30 MG capsule Take 30 mg by mouth daily.   Yes [provider]  ibuprofen (ADVIL,MOTRIN) 800 MG tablet Take 800 mg by mouth every 8 (eight) hours as needed for headache or moderate pain.    Yes [provider]  morphine (MSIR) 15 MG tablet Take 1 tablet (15 mg total) by mouth every 4 (four) hours as needed for severe pain. Patient taking differently: Take 15 mg  by mouth daily as needed for severe pain.  02/01/18  Yes [name], Dan, DO  Probiotic Product ([employer] PROBIOTIC) CHEW Chew 2 each by mouth daily.   Yes [provider]  psyllium (METAMUCIL) 58.6 % powder Take 1 packet by mouth as needed.   Yes [provider]  traZODone (DESYREL) 50 MG tablet Take 50 mg by mouth at bedtime.    Yes [provider]  Multiple Vitamin (MULTIVITAMIN) tablet Take 1 tablet by mouth daily.    [provider]  oxyCODONE-acetaminophen (PERCOCET/ROXICET) 5-325 MG tablet Take 1-2 tablets by mouth every 4 (four) hours as needed for moderate pain. Patient not taking: Reported on 02/03/2018 01/08/16   Barnett AbuElsner, Henry, MD     Positive ROS: All other systems have been reviewed and were otherwise negative with the exception of those mentioned in the HPI and as  above.  Physical Exam: General: Alert, no acute distress Cardiovascular: No pedal edema Respiratory: No cyanosis, no use of accessory musculature GI: abdomen soft Skin: No lesions in the area of chief complaint Neurologic: Sensation intact distally Psychiatric: Patient is competent for consent with normal mood and affect Lymphatic: no lymphedema  MUSCULOSKELETAL: exam stable  Assessment: left bimalleolar ankle fracture  Plan: Plan for Procedure(s): OPEN REDUCTION INTERNAL FIXATION (ORIF) LEFT BIMALLEOLAR ANKLE FRACTURE  The risks benefits and alternatives were discussed with the patient including but not limited to the risks of nonoperative treatment, versus surgical intervention including infection, bleeding, nerve injury,  blood clots, cardiopulmonary complications, morbidity, mortality, among others, and they were willing to proceed.   Glee ArvinMichael Milia Warth, MD   02/07/2018 7:33 AM

## 2018-02-07 NOTE — Anesthesia Preprocedure Evaluation (Signed)
Anesthesia Evaluation  Patient identified by MRN, date of birth, ID band Patient awake    Reviewed: Allergy & Precautions, NPO status , Patient's Chart, lab work & pertinent test results  Airway Mallampati: II  TM Distance: >3 FB Neck ROM: Full    Dental no notable dental hx. (+) Teeth Intact, Dental Advisory Given   Pulmonary    Pulmonary exam normal breath sounds clear to auscultation       Cardiovascular negative cardio ROS Normal cardiovascular exam Rhythm:Regular Rate:Normal     Neuro/Psych  Neuromuscular disease negative neurological ROS  negative psych ROS   GI/Hepatic negative GI ROS, Neg liver ROS,   Endo/Other    Renal/GU negative Renal ROS  negative genitourinary   Musculoskeletal  (+) Arthritis ,   Abdominal   Peds negative pediatric ROS (+)  Hematology   Anesthesia Other Findings   Reproductive/Obstetrics                             Lab Results  Component Value Date   WBC 5.2 01/02/2016   HGB 13.4 01/02/2016   HCT 40.3 01/02/2016   MCV 88.8 01/02/2016   PLT 162 01/02/2016    Anesthesia Physical Anesthesia Plan  ASA: II  Anesthesia Plan: General and Regional   Post-op Pain Management:    Induction: Intravenous  PONV Risk Score and Plan: Treatment may vary due to age or medical condition and Ondansetron  Airway Management Planned: LMA  Additional Equipment:   Intra-op Plan:   Post-operative Plan:   Informed Consent: I have reviewed the patients History and Physical, chart, labs and discussed the procedure including the risks, benefits and alternatives for the proposed anesthesia with the patient or authorized representative who has indicated his/her understanding and acceptance.   Dental advisory given  Plan Discussed with:   Anesthesia Plan Comments:         Anesthesia Quick Evaluation

## 2018-02-07 NOTE — Anesthesia Procedure Notes (Addendum)
Anesthesia Regional Block: Popliteal block   Pre-Anesthetic Checklist: ,, timeout performed, Correct Patient, Correct Site, Correct Laterality, Correct Procedure, Correct Position, site marked, Risks and benefits discussed, pre-op evaluation,  At surgeon's request and post-op pain management  Laterality: Left  Prep: Maximum Sterile Barrier Precautions used, chloraprep       Needles:  Injection technique: Single-shot  Needle Type: Echogenic Needle     Needle Length: 9cm  Needle Gauge: 21     Additional Needles:   Procedures:,,,, ultrasound used (permanent image in chart),,,,  Narrative:  Start time: 02/07/2018 10:59 AM End time: 02/07/2018 11:20 AM Injection made incrementally with aspirations every 5 mL. Anesthesiologist: Trevor IhaHouser, Jari Dipasquale A, MD  Additional Notes: Adductor canal block performed with 10cc of 0.5 % ropivicaine to cover the saphenous N

## 2018-02-07 NOTE — Discharge Instructions (Signed)
° ° °  1. Keep splint clean and dry °2. Elevate foot above level of the heart °3. Take aspirin to prevent blood clots °4. Take pain meds as needed °5. Strict non weight bearing to operative extremity ° °

## 2018-02-07 NOTE — Op Note (Addendum)
Date of Surgery: 02/07/2018  INDICATIONS: [name] is a 62 y.o.-year-old female who sustained a left ankle fracture; she was indicated for open reduction and internal fixation due to the displaced nature of the articular fracture and came to the operating room today for this procedure. The patient did consent to the procedure after discussion of the risks and benefits.  PREOPERATIVE DIAGNOSIS: left trimalleolar ankle fracture  POSTOPERATIVE DIAGNOSIS: Same.  PROCEDURE: Open treatment of left ankle fracture with internal fixation. Trimalleolar w/o fixation of posterior malleolus CPT 27822.   SURGEON: N. Glee ArvinMichael Xu, M.D.  ASSIST: Starlyn SkeansMary Lindsey WaterlooStanbery, [place]-C; necessary for the timely completion of procedure and due to complexity of procedure.  ANESTHESIA:  general, popliteal block  TOURNIQUET TIME: less than 60 mins  IV FLUIDS AND URINE: See anesthesia.  ESTIMATED BLOOD LOSS: minimal mL.  IMPLANTS: [employer] Nephew  COMPLICATIONS: None.  DESCRIPTION OF PROCEDURE: The patient was brought to the operating room and placed supine on the operating table.  The patient had been signed prior to the procedure and this was documented. The patient had the anesthesia placed by the anesthesiologist.  A nonsterile tourniquet was placed on the upper thigh.  The prep verification and incision time-outs were performed to confirm that this was the correct patient, site, side and location. The patient had an SCD on the opposite lower extremity. The patient did receive antibiotics prior to the incision and was re-dosed during the procedure as needed at indicated intervals.  The patient had the lower extremity prepped and draped in the standard surgical fashion.  The extremity was exsanguinated using an esmarch bandage and the tourniquet was inflated to 300 mm Hg.  An incision was first made on the lateral aspect of the distal fibula.  Dissection was carried through the subcutaneous tissue.  Superficial  peroneal nerve was identified and retracted and mobilized.  We continued our dissection down onto the fibula.  Subperiosteal elevation was performed.  The fracture was exposed.  Organized hematoma was removed from the fracture site.  The fracture was then brought out to length and reduced and clamped and then confirmed under fluoroscopy.  I then placed a single 2.7 mm anterior to posterior lag screw which had excellent purchase.  The clamp was then removed and the fracture remained reduced.  We then placed a 7-hole semitubular plate on the lateral aspect of the fibula using fluoroscopic guidance.  I then placed a single nonlocking screw proximal to the fracture in order to contour the plate down to the bone.  This screw had excellent purchase.  I then placed 2 more nonlocking screws proximal to the fracture each with excellent purchase.  I then placed a cancellus unicortical screw distal to the fracture.  This was made sure not to violate the ankle joint.  I then placed a unicortical locking screw distal to that screw using fluoroscopic guidance.  I then turned my attention to the medial malleolus fracture.  A separate incision was made on the medial side of the ankle.  Dissection was carried through subcutaneous tissue.  Branches of the saphenous nerve and vein were mobilized and retracted.  I continued my dissection down to the medial malleolus fracture.  Subperiosteal elevation was performed.  Entrapped periosteum was removed from the fracture site.  There is no evidence of any intra-articular loose bodies or fragments.  The joint was then thoroughly lavaged.  I then obtained a reduction of the fracture using a dental pick and a tenaculum clamp.  2 parallel K wires were then advanced up the medial malleolus across the fracture using fluoroscopic guidance.  The near cortex was then drilled and a partially threaded 36 mm screw was placed over the posterior wire with excellent purchase.  I then placed a 35 mm fully  threaded screw over the anterior wire again with excellent purchase.  The K wires were then removed.  Stress exam of the ankle showed no widening of the medial clear space.  The small posterior malleolus fracture did not need fixation.  The wounds were then thoroughly irrigated and closed in layered fashion using 0 Vicryl, 2-0 Vicryl, 3-0 nylon.  Sterile dressings were applied.  Foot was immobilized in a short leg splint at 90 degrees.  Patient tolerated procedure well had no immediate complications.  POSTOPERATIVE PLAN: [name] will remain nonweightbearing on this leg for approximately 6 weeks; [name] will return for suture removal in 2 weeks.  He will be immobilized in a short leg splint and then transitioned to a CAM walker at his first follow up appointment.  [name] will receive DVT prophylaxis based on other medications, activity level, and risk ratio of bleeding to thrombosis.  Mayra Reel, MD [facility] [phone] 1:00 PM

## 2018-02-07 NOTE — Transfer of Care (Signed)
Immediate Anesthesia Transfer of Care Note  Patient: Angela Cook  Procedure(s) Performed: OPEN REDUCTION INTERNAL FIXATION (ORIF) LEFT BIMALLEOLAR ANKLE FRACTURE (Left Ankle)  Patient Location: PACU  Anesthesia Type:GA combined with regional for post-op pain  Level of Consciousness: awake, alert , drowsy and patient cooperative  Airway & Oxygen Therapy: Patient Spontanous Breathing and Patient connected to nasal cannula oxygen  Post-op Assessment: Report given to RN and Post -op Vital signs reviewed and stable  Post vital signs: Reviewed and stable  Last Vitals:  Vitals Value Taken Time  BP 123/72 02/07/2018  1:23 PM  Temp    Pulse 97 02/07/2018  1:24 PM  Resp 14 02/07/2018  1:24 PM  SpO2 100 % 02/07/2018  1:24 PM  Vitals shown include unvalidated device data.  Last Pain:  Vitals:   02/07/18 1053  TempSrc: Oral  PainSc:       Patients Stated Pain Goal: 6 (02/07/18 1036)  Complications: No apparent anesthesia complications

## 2018-02-07 NOTE — Anesthesia Procedure Notes (Signed)
Procedure Name: LMA Insertion Date/Time: 02/07/2018 11:45 AM Performed by: Adria Dillonkin, Amneet Cendejas A, CRNA Pre-anesthesia Checklist: Patient identified, Emergency Drugs available, Suction available and Patient being monitored Patient Re-evaluated:Patient Re-evaluated prior to induction Oxygen Delivery Method: Circle system utilized Preoxygenation: Pre-oxygenation with 100% oxygen Induction Type: IV induction Ventilation: Mask ventilation without difficulty LMA: LMA inserted LMA Size: 4.0 Placement Confirmation: positive ETCO2,  CO2 detector and breath sounds checked- equal and bilateral Tube secured with: Tape Dental Injury: Teeth and Oropharynx as per pre-operative assessment

## 2018-02-07 NOTE — Anesthesia Postprocedure Evaluation (Signed)
Anesthesia Post Note  Patient: Natalia LeatherwoodLynn P Reynoso  Procedure(s) Performed: OPEN REDUCTION INTERNAL FIXATION (ORIF) LEFT BIMALLEOLAR ANKLE FRACTURE (Left Ankle)     Patient location during evaluation: PACU Anesthesia Type: Regional Level of consciousness: awake and alert Pain management: pain level controlled Vital Signs Assessment: post-procedure vital signs reviewed and stable Respiratory status: spontaneous breathing, nonlabored ventilation, respiratory function stable and patient connected to nasal cannula oxygen Cardiovascular status: blood pressure returned to baseline and stable Postop Assessment: no apparent nausea or vomiting Anesthetic complications: no    Last Vitals:  Vitals:   02/07/18 1130 02/07/18 1323  BP: (!) 105/50   Pulse: 70   Resp: 10   Temp:  (!) 36.4 C  SpO2: 98%     Last Pain:  Vitals:   02/07/18 1356  TempSrc:   PainSc: 0-No pain                 Trevor IhaStephen A Marthena Whitmyer

## 2018-02-09 ENCOUNTER — Encounter (HOSPITAL_COMMUNITY): Payer: Self-pay | Admitting: Orthopaedic Surgery

## 2018-02-17 ENCOUNTER — Ambulatory Visit (INDEPENDENT_AMBULATORY_CARE_PROVIDER_SITE_OTHER): Payer: 59 | Admitting: Orthopaedic Surgery

## 2018-02-17 ENCOUNTER — Ambulatory Visit (INDEPENDENT_AMBULATORY_CARE_PROVIDER_SITE_OTHER): Payer: 59

## 2018-02-17 DIAGNOSIS — S82852D Displaced trimalleolar fracture of left lower leg, subsequent encounter for closed fracture with routine healing: Secondary | ICD-10-CM

## 2018-02-17 DIAGNOSIS — M25572 Pain in left ankle and joints of left foot: Secondary | ICD-10-CM

## 2018-02-17 DIAGNOSIS — S82853D Displaced trimalleolar fracture of unspecified lower leg, subsequent encounter for closed fracture with routine healing: Secondary | ICD-10-CM | POA: Insufficient documentation

## 2018-02-17 DIAGNOSIS — S82842A Displaced bimalleolar fracture of left lower leg, initial encounter for closed fracture: Secondary | ICD-10-CM

## 2018-02-17 NOTE — Progress Notes (Signed)
   Post-Op Visit Note   Patient: Natalia LeatherwoodLynn P Coupland           Date of Birth: 03/12/1956           MRN: [id number] Visit Date: 02/17/2018 PCP: Deloris Pingyter-Brown, Sherry M, MD   Assessment & Plan:  Chief Complaint:  Chief Complaint  Patient presents with  . Left Ankle - Routine Post Op    02/07/18 ORIF bimalleolar fracture   Visit Diagnoses:  1. Closed displaced trimalleolar fracture of left ankle with routine healing, subsequent encounter   2. Pain in left ankle and joints of left foot     Plan: Bonita QuinLinda is 10 days status post ORIF left trimalleolar ankle fracture.  Overall she is doing well.  Her pain is mild.  She has been very compliant with elevation and nonweightbearing.  Her surgical incisions are clean dry and intact without any evidence of infection.  Her x-rays demonstrate stable fixation alignment of fracture.  From my standpoint everything looks good today.  I would like to keep the sutures in for a few more days therefore I would like to have her come back to see us next week for suture removal.  Cam walker was provided today.  Out of work for 6 weeks.  Follow-Up Instructions: Return in about 4 days (around 02/21/2018) for with lindsey.   Orders:  Orders Placed This Encounter  Procedures  . XR Ankle Complete Left   No orders of the defined types were placed in this encounter.   Imaging: Xr Ankle Complete Left  Result Date: 02/17/2018 Stable alignment and fixation of trimalleolar ankle fracture   PMFS History: Patient Active Problem List   Diagnosis Date Noted  . Closed displaced trimalleolar fracture of lower leg with routine healing 02/17/2018  . Lumbar stenosis with neurogenic claudication 01/07/2016  . Lumbar pseudoarthrosis 07/06/2014   Past Medical History:  Diagnosis Date  . Arthritis   . Asthma    as a child, no issues since age 89  . Constipation due to opioid therapy   . Fibromyalgia    legs from back   . Pneumonia    as a child    No family history on file.    Past Surgical History:  Procedure Laterality Date  . BACK SURGERY  07/05/13, 2015   lumbar 4-5, 2015- new bone graft inserted  . BUNIONECTOMY Left 2014  . CARPAL TUNNEL RELEASE Right 1986   left 1991  . CARPAL TUNNEL RELEASE Left   . CESAREAN SECTION  [phone]  . COLONOSCOPY    . LUMBAR LAMINECTOMY/DECOMPRESSION MICRODISCECTOMY Bilateral 01/07/2016   Procedure: DECOMPRESSIONL LUMBAR THREE-FOUR  WITH FORAMINOTOMIES ;  Surgeon: Barnett AbuHenry Elsner, MD;  Location: [facility];  Service: Neurosurgery;  Laterality: Bilateral;  . ORIF ANKLE FRACTURE Left 02/07/2018   Procedure: OPEN REDUCTION INTERNAL FIXATION (ORIF) LEFT BIMALLEOLAR ANKLE FRACTURE;  Surgeon: Tarry KosXu, Benjiman Sedgwick M, MD;  Location: [facility];  Service: Orthopedics;  Laterality: Left;  . SPINAL CORD STIMULATOR IMPLANT    . TUBAL LIGATION  1986   Social History   Occupational History  . Not on file  Tobacco Use  . Smoking status: Never Smoker  . Smokeless tobacco: Never Used  Substance and Sexual Activity  . Alcohol use: No    Frequency: Never  . Drug use: No  . Sexual activity: Not on file

## 2018-02-21 ENCOUNTER — Encounter (INDEPENDENT_AMBULATORY_CARE_PROVIDER_SITE_OTHER): Payer: Self-pay | Admitting: Physician Assistant

## 2018-02-21 ENCOUNTER — Ambulatory Visit (INDEPENDENT_AMBULATORY_CARE_PROVIDER_SITE_OTHER): Payer: 59 | Admitting: Physician Assistant

## 2018-02-21 DIAGNOSIS — S82852D Displaced trimalleolar fracture of left lower leg, subsequent encounter for closed fracture with routine healing: Secondary | ICD-10-CM

## 2018-02-21 MED ORDER — HYDROCODONE-ACETAMINOPHEN 5-325 MG PO TABS: 1.0000 | ORAL_TABLET | Freq: Three times a day (TID) | ORAL | 0 refills | Status: AC | PRN

## 2018-02-21 NOTE — Progress Notes (Signed)
Post-Op Visit Note   Patient: Angela Cook           Date of Birth: 08-31-1955           MRN: [id number] Visit Date: 02/21/2018 PCP: Deloris Pingyter-Brown, Sherry M, MD   Assessment & Plan:  Chief Complaint:  Chief Complaint  Patient presents with  . Left Ankle - Pain, Follow-up, Routine Post Op   Visit Diagnoses:  1. Closed displaced trimalleolar fracture of left ankle with routine healing, subsequent encounter     Plan: Patient is a pleasant 62 year old female who presents to our clinic today 14 days status post ORIF left trimalleolar ankle fracture, date of surgery 02/07/2018.  She has been doing well.  She has been wearing her cam walker and elevating and applying ice as needed for pain and swelling.  No fevers, chills or any other systemic symptoms.  Examination of her left ankle reveals a well-healing surgical incision with nylon sutures in place.  No evidence of cellulitis or infection.  She does have mild swelling.  She is neurovascularly intact distally.  Calf soft nontender.  Today, nylon sutures were removed and Steri-Strips applied.  I have refilled her pain medication and at this point [facility].  She will remain nonweightbearing in her cam walker for the next 4 weeks.  Follow-up with us in 4 weeks time for repeat evaluation and x-ray.  Follow-Up Instructions: Return in about 1 month (around 03/21/2018).   Orders:  No orders of the defined types were placed in this encounter.  Meds ordered this encounter  Medications  . HYDROcodone-acetaminophen (NORCO) 5-325 MG tablet    Sig: Take 1-2 tablets by mouth 3 (three) times daily as needed for moderate pain.    Dispense:  30 tablet    Refill:  0    Imaging: No new imaging  PMFS History: Patient Active Problem List   Diagnosis Date Noted  . Closed displaced trimalleolar fracture of lower leg with routine healing 02/17/2018  . Lumbar stenosis with neurogenic claudication 01/07/2016  . Lumbar pseudoarthrosis  07/06/2014   Past Medical History:  Diagnosis Date  . Arthritis   . Asthma    as a child, no issues since age 89  . Constipation due to opioid therapy   . Fibromyalgia    legs from back   . Pneumonia    as a child    History reviewed. No pertinent family history.  Past Surgical History:  Procedure Laterality Date  . BACK SURGERY  07/05/13, 2015   lumbar 4-5, 2015- new bone graft inserted  . BUNIONECTOMY Left 2014  . CARPAL TUNNEL RELEASE Right 1986   left 1991  . CARPAL TUNNEL RELEASE Left   . CESAREAN SECTION  [phone]  . COLONOSCOPY    . LUMBAR LAMINECTOMY/DECOMPRESSION MICRODISCECTOMY Bilateral 01/07/2016   Procedure: DECOMPRESSIONL LUMBAR THREE-FOUR  WITH FORAMINOTOMIES ;  Surgeon: Barnett AbuHenry Elsner, MD;  Location: [facility];  Service: Neurosurgery;  Laterality: Bilateral;  . ORIF ANKLE FRACTURE Left 02/07/2018   Procedure: OPEN REDUCTION INTERNAL FIXATION (ORIF) LEFT BIMALLEOLAR ANKLE FRACTURE;  Surgeon: Tarry KosXu, Naiping M, MD;  Location: [facility];  Service: Orthopedics;  Laterality: Left;  . SPINAL CORD STIMULATOR IMPLANT    . TUBAL LIGATION  1986   Social History   Occupational History  . Not on file  Tobacco Use  . Smoking status: Never Smoker  . Smokeless tobacco: Never Used  Substance and Sexual Activity  . Alcohol use: No  Frequency: Never  . Drug use: No  . Sexual activity: Not on file

## 2018-03-10 ENCOUNTER — Other Ambulatory Visit: Payer: Self-pay

## 2018-03-10 ENCOUNTER — Ambulatory Visit: Payer: 59 | Attending: Family Medicine | Admitting: Physical Therapy

## 2018-03-10 DIAGNOSIS — M5441 Lumbago with sciatica, right side: Secondary | ICD-10-CM | POA: Diagnosis present

## 2018-03-10 DIAGNOSIS — M5442 Lumbago with sciatica, left side: Secondary | ICD-10-CM | POA: Diagnosis present

## 2018-03-10 DIAGNOSIS — R252 Cramp and spasm: Secondary | ICD-10-CM

## 2018-03-10 DIAGNOSIS — M79621 Pain in right upper arm: Secondary | ICD-10-CM | POA: Diagnosis present

## 2018-03-10 DIAGNOSIS — R293 Abnormal posture: Secondary | ICD-10-CM | POA: Insufficient documentation

## 2018-03-10 DIAGNOSIS — G8929 Other chronic pain: Secondary | ICD-10-CM

## 2018-03-10 NOTE — Therapy (Signed)
[facility] [address]  [address] [place], [place], [address] Phone: [phone]   Fax:  [phone]  Physical Therapy Evaluation  Patient Details  Name: Angela Cook MRN: [id number] Date of Birth: October 30, 1955 Referring Provider: Pricilla Holm, MD   Encounter Date: 03/10/2018  Angela Cook End of Session - 03/10/18 1015    Visit Number  1    Number of Visits  12    Date for Angela Cook Re-Evaluation  04/21/18    Authorization Type  [employer] (no ionto)    Angela Cook Start Time  1015    Angela Cook Stop Time  1104    Angela Cook Time Calculation (min)  49 min    Activity Tolerance  Patient tolerated treatment well    Behavior During Therapy  [facility] for tasks assessed/performed       Past Medical History:  Diagnosis Date  . Arthritis   . Asthma    as a child, no issues since age 80  . Constipation due to opioid therapy   . Fibromyalgia    legs from back   . Pneumonia    as a child    Past Surgical History:  Procedure Laterality Date  . BACK SURGERY  07/05/13, 2015   lumbar 4-5, 2015- new bone graft inserted  . BUNIONECTOMY Left 2014  . CARPAL TUNNEL RELEASE Right 1986   left 1991  . CARPAL TUNNEL RELEASE Left   . CESAREAN SECTION  [phone]  . COLONOSCOPY    . LUMBAR LAMINECTOMY/DECOMPRESSION MICRODISCECTOMY Bilateral 01/07/2016   Procedure: DECOMPRESSIONL LUMBAR THREE-FOUR  WITH FORAMINOTOMIES ;  Surgeon: Barnett Abu, MD;  Location: [facility];  Service: Neurosurgery;  Laterality: Bilateral;  . ORIF ANKLE FRACTURE Left 02/07/2018   Procedure: OPEN REDUCTION INTERNAL FIXATION (ORIF) LEFT BIMALLEOLAR ANKLE FRACTURE;  Surgeon: Tarry Kos, MD;  Location: [facility] OR;  Service: Orthopedics;  Laterality: Left;  . SPINAL CORD STIMULATOR IMPLANT    . TUBAL LIGATION  1986    There were no vitals filed for this visit.   Subjective Assessment - 03/10/18 1019    Subjective  Angela Cook reports h/o failed lumbar fusion in 2014 with multiple subsequent back surgeries  ending with implanted spinal stimulator late last year. Has had issues with ongoing buttock and radicaulr pain but feels like when she tried to start working out for her back several months ago (Feb 2019), she pulled a muscle in her pelvic floor and has had pain with sitting since. Also notes pain in R upper back/arm with radicular symptoms into R hand x ~1 yr. Recently suffered a fall with a L trimalleolar fracture on 02/07/18 and has been NWB on L with knee scooter since - knee scooter was borrowed and is slightly too tall for her even at the lowest setting - feels uneven height from scooter as well as need to steer scooter has exacerbated her buttock and arm pain.    Pertinent History  L4-5 MIS TLIF with iliac crest bone graft 07/05/2013; Revision of posterior interbody fusion 07/06/14; Bil laminotomies and foraminotomies L3-L4 with decompression of L3 and L4 nerve roots 01/07/16; Thoracic laminectomies with placement of implanted spinal stimulator 05/25/17; L ankle trimalleolar ankle fracture s/p ORIF on 02/07/18    Limitations  Sitting;Standing;House hold activities;Walking    How long can you sit comfortably?  5 minutes    How long can you stand comfortably?  5 minutes    How long can you walk comfortably?  not normally limited; currently  limited due to L LE NWB requiring use of knee scooter    Patient Stated Goals  "reduce pain in R arm and buttocks"    Currently in Pain?  Yes    Pain Score  6     Pain Location  Buttocks   localized at ischial tuberosities   Pain Orientation  Left;Right   L > R   Pain Descriptors / Indicators  Throbbing    Pain Type  Chronic pain    Pain Onset  More than a month ago   since initial back surgery in 2014   Pain Frequency  Constant    Aggravating Factors   sitting    Pain Relieving Factors  walking, laying flat with pillow under knees, full reclining in recliner    Effect of Pain on Daily Activities  impacts "everything I do", esp sitting tolerance    Multiple  Pain Sites  Yes    Pain Score  6    Pain Location  Arm    Pain Orientation  Right;Upper    Pain Type  Chronic pain    Pain Radiating Towards  intermittent tingling/numbess down into R fingers    Pain Onset  More than a month ago   ~1 year   Pain Frequency  Intermittent    Aggravating Factors   using computer mouse, driving knee scooter    Pain Relieving Factors  [address] of [address]    Effect of Pain on Daily Activities  limited tolerance for computer use at work         [facility] Angela Cook Assessment - 03/10/18 1015      Assessment   Medical Diagnosis  Spondlylolithesis    Referring Provider  Pricilla Holm, MD    Next MD Visit  none scheduled    Prior Therapy  Angela Cook x 3 after back surgeries      Precautions   Required Braces or Orthoses  Other Brace/Splint    Other Brace/Splint  L foot - CAM walker      Restrictions   Weight Bearing Restrictions  Yes    LLE Weight Bearing  Non weight bearing      Balance Screen   Has the patient fallen in the past 6 months  Yes    How many times?  1   resulting in L trimalleolar ankle fracture   Has the patient had a decrease in activity level because of a fear of falling?   Yes    Is the patient reluctant to leave their home because of a fear of falling?   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    Leisure  walk 2 miles/day, playing with grandkids, cooking      Posture/Postural Control   Posture/Postural Control   Postural limitations  Postural Limitations  Forward head;Rounded Shoulders      ROM / Strength   AROM / PROM / Strength  AROM;Strength      AROM   Overall AROM   Within functional limits for tasks performed;Deficits;Due to pain    Overall AROM Comments  slight restriction in R shoulder due to pain/tightness at end ROM for all motions - greatest discomfort with horiz add/abd; lumbar ROM moderately limited in all directions d/t pain & inability to bear weight on L foot    AROM Assessment Site  Shoulder;Lumbar      Strength   Overall Strength  Unable to assess      Palpation   Palpation comment  ttp with increased muscle tension in B glutes & piriformis L>R; ttp with increased muscle tension throughout R shoulder/scapular complex                Objective measurements completed on examination: See above findings.              Angela Cook Education - 03/10/18 1104    Education Details  Angela Cook eval findings & anticipated POC; initial postural education including work station set-up for return to work next Monday    Person(s) Educated  Patient    Methods  Explanation    Comprehension  Verbalized understanding       Angela Cook Short Term Goals - 03/10/18 1104      Angela Cook SHORT TERM GOAL #1   Title  Independent with initial HEP    Status  New    Target Date  03/31/18      Angela Cook SHORT TERM GOAL #2   Title  Angela Cook will verbalize/demonstrate understanding of appropriate posture and body mechanics needed for daily activities including work station set-up    Baseline  Patient to demonstrate     Status  New    Target Date  03/31/18        Angela Cook Long Term Goals - 03/10/18 1104      Angela Cook LONG TERM GOAL #1   Title  Independent with ongoing/advanced HEP    Status  New    Target Date  04/21/18      Angela Cook LONG TERM GOAL #2   Title  Angela Cook will report ability to sit for >/= 30 minutes w/o limitation due to buttock pain to allow for increased focus at work and improved tolerance for driving    Status  New     Target Date  04/21/18      Angela Cook LONG TERM GOAL #3   Title  Angela Cook will report ability to complete work tasks on computer inlcuding use of mouse with pain </= 3/10    Status  New    Target Date  04/21/18             Plan - 03/10/18 1831    Clinical Impression Statement  Angela Cook is a 62 y/o female who presents to OP Angela Cook for B buttock and R UE pain associated with spondylolisthesis. History remarkable for failed lumbar fusion with multiple subsequent surgeries for pain control culminating in placement of an implanted spinal stimulator for pain control in 05/2017. Angela Cook reports reasonably good control of LE radicular symptoms from spinal stimulator, but states it does not seem to help the buttock pain which she feels was exacerbated by pulling a muscle in her pelvic floor while trying to resume working out back in February of this year. Unclear if R proximal UE pain is also radicular in nature, but  assessment significant for abnormal shoulder posture with increased muscle tension throughout shoulder/scapular muscle complex with mild restrictions in R shoulder ROM due to pain/tightness at end ROM. Current issues complicated by recent L trimalleolar ankle s/p ORIF on 02/07/18 for which she remains NWB on L in CAM walker boot necessitating use of knee scooter for mobility. Angela Cook from skilled Angela Cook to address deficits listed and allow return to normal activity level and job performance with decreased pain interference.    History and Personal Factors relevant to plan of care:  L4-5 MIS TLIF with iliac crest bone graft 07/05/2013; Revision of posterior interbody fusion 07/06/14; Bil laminotomies and foraminotomies L3-L4 with decompression of L3 and L4 nerve roots 01/07/16; Thoracic laminectomies with placement of implanted spinal stimulator 05/25/17; L ankle trimalleolar ankle fracture s/p ORIF on 02/07/18    Clinical Presentation due to:  medical complexity and chronicity of back pain and radicular symptoms s/p  multiple surgeries + cormorbidity of L trimalleolar ankle fracture    Rehab Potential  Good    Angela Cook Frequency  2x / week    Angela Cook Duration  6 weeks    Angela Cook Treatment/Interventions  Patient/family education;Manual techniques;Dry needling;Taping;Moist Heat;Cryotherapy;Iontophoresis 4mg /ml Dexamethasone;Ultrasound;Therapeutic exercise;Therapeutic activities;Functional mobility training;Gait training;Neuromuscular re-education;ADLs/Self Care Home Management    Consulted and Agree with Plan of Care  Patient       Patient will Cook from skilled therapeutic intervention in order to improve the following deficits and impairments:  Pain, Increased muscle spasms, Decreased range of motion, Impaired flexibility, Decreased strength, Difficulty walking, Abnormal gait, Decreased activity tolerance, Improper body mechanics, Postural dysfunction  Visit Diagnosis: Chronic bilateral low back pain with bilateral sciatica  Pain of right upper arm  Cramp and spasm  Abnormal posture     Problem List Patient Active Problem List   Diagnosis Date Noted  . Closed displaced trimalleolar fracture of lower leg with routine healing 02/17/2018  . Lumbar stenosis with neurogenic claudication 01/07/2016  . Lumbar pseudoarthrosis 07/06/2014    Angela Cook, Angela Cook, Angela Cook 03/10/2018, 7:32 PM  [facility] [address]  [address] [place], [place], [address] Phone: [phone]   Fax:  [phone]  Name: Angela Cook MRN: [id number] Date of Birth: 10/29/1955

## 2018-03-15 ENCOUNTER — Encounter: Payer: Self-pay | Admitting: Physical Therapy

## 2018-03-15 ENCOUNTER — Ambulatory Visit: Payer: 59 | Admitting: Physical Therapy

## 2018-03-15 DIAGNOSIS — R252 Cramp and spasm: Secondary | ICD-10-CM

## 2018-03-15 DIAGNOSIS — M5441 Lumbago with sciatica, right side: Principal | ICD-10-CM

## 2018-03-15 DIAGNOSIS — M5442 Lumbago with sciatica, left side: Secondary | ICD-10-CM | POA: Diagnosis not present

## 2018-03-15 DIAGNOSIS — R293 Abnormal posture: Secondary | ICD-10-CM

## 2018-03-15 DIAGNOSIS — G8929 Other chronic pain: Secondary | ICD-10-CM

## 2018-03-15 DIAGNOSIS — M79621 Pain in right upper arm: Secondary | ICD-10-CM

## 2018-03-15 NOTE — Therapy (Signed)
[facility] [place] [address]  [address] [place], [place], [address] Phone: [phone]   Fax:  [phone]  Physical Therapy Treatment  Patient Details  Name: Angela Cook MRN: [id number] Date of Birth: Oct 14, 1955 Referring Provider: Pricilla HolmSherry Ryter-Brown, MD   Encounter Date: 03/15/2018  PT End of Session - 03/15/18 1400    Visit Number  2    Number of Visits  12    Date for PT Re-Evaluation  04/21/18    Authorization Type  Aetna (no ionto)    PT Start Time  1400    PT Stop Time  1458    PT Time Calculation (min)  58 min    Activity Tolerance  Patient tolerated treatment well    Behavior During Therapy  [facility] for tasks assessed/performed       Past Medical History:  Diagnosis Date  . Arthritis   . Asthma    as a child, no issues since age 89  . Constipation due to opioid therapy   . Fibromyalgia    legs from back   . Pneumonia    as a child    Past Surgical History:  Procedure Laterality Date  . BACK SURGERY  07/05/13, 2015   lumbar 4-5, 2015- new bone graft inserted  . BUNIONECTOMY Left 2014  . CARPAL TUNNEL RELEASE Right 1986   left 1991  . CARPAL TUNNEL RELEASE Left   . CESAREAN SECTION  [phone]  . COLONOSCOPY    . LUMBAR LAMINECTOMY/DECOMPRESSION MICRODISCECTOMY Bilateral 01/07/2016   Procedure: DECOMPRESSIONL LUMBAR THREE-FOUR  WITH FORAMINOTOMIES ;  Surgeon: Barnett AbuHenry Elsner, MD;  Location: [facility];  Service: Neurosurgery;  Laterality: Bilateral;  . ORIF ANKLE FRACTURE Left 02/07/2018   Procedure: OPEN REDUCTION INTERNAL FIXATION (ORIF) LEFT BIMALLEOLAR ANKLE FRACTURE;  Surgeon: Tarry KosXu, Naiping M, MD;  Location: [facility] OR;  Service: Orthopedics;  Laterality: Left;  . SPINAL CORD STIMULATOR IMPLANT    . TUBAL LIGATION  1986    There were no vitals filed for this visit.  Subjective Assessment - 03/15/18 1404    Subjective  Pt returned to work 2hr/day yesterday - increased pain/soreness but iced x 2 after she  got home with some relief.  Feels like suggestions for work station set-up and mouse are helping with shoulder pain but still wakes up with increased pain.    Pertinent History  L4-5 MIS TLIF with iliac crest bone graft 07/05/2013; Revision of posterior interbody fusion 07/06/14; Bil laminotomies and foraminotomies L3-L4 with decompression of L3 and L4 nerve roots 01/07/16; Thoracic laminectomies with placement of implanted spinal stimulator 05/25/17; L ankle trimalleolar ankle fracture s/p ORIF on 02/07/18    Patient Stated Goals  "reduce pain in R arm and buttocks"    Currently in Pain?  Yes    Pain Score  7     Pain Location  Buttocks    Pain Orientation  Right;Left   R > L   Pain Type  Chronic pain    Pain Score  5    Pain Location  Arm    Pain Orientation  Right;Upper    Pain Type  Chronic pain                       [facility] Adult PT Treatment/Exercise - 03/15/18 1400      Exercises   Exercises  Shoulder;Lumbar      Lumbar Exercises: Stretches   Piriformis Stretch  Right;30 seconds;1 rep  Piriformis Stretch Limitations  seated KTOS    Figure 4 Stretch  30 seconds;1 rep;With overpressure    Figure 4 Stretch Limitations  seated with fwd lean & slight overpressure      Shoulder Exercises: Stretch   Cross Chest Stretch  30 seconds;1 rep    Cross Chest Stretch Limitations  R posterior capsule stretch    Other Shoulder Stretches  R UT & LS stretches 2 x 30 sec      Manual Therapy   Manual Therapy  Soft tissue mobilization;Myofascial release    Manual therapy comments  prone    Soft tissue mobilization  STM to R glutes & piriformis; R UT, LS, and RTC musculature    Myofascial Release  manual TPR to R piriformis & R UT             PT Education - 03/15/18 1457    Education Details  Initial HEP    Person(s) Educated  Patient    Methods  Explanation;Demonstration;Handout    Comprehension  Verbalized understanding;Returned demonstration       PT Short Term  Goals - 03/15/18 1421      PT SHORT TERM GOAL #1   Title  Independent with initial HEP    Status  On-going      PT SHORT TERM GOAL #2   Title  Pt will verbalize/demonstrate understanding of appropriate posture and body mechanics needed for daily activities including work station set-up    Baseline  Patient to demonstrate     Status  On-going        PT Long Term Goals - 03/15/18 1428      PT LONG TERM GOAL #1   Title  Independent with ongoing/advanced HEP    Status  On-going      PT LONG TERM GOAL #2   Title  Pt will report ability to sit for >/= 30 minutes w/o limitation due to buttock pain to allow for increased focus at work and improved tolerance for driving    Status  On-going      PT LONG TERM GOAL #3   Title  Pt will report ability to complete work tasks on computer inlcuding use of mouse with pain </= 3/10    Status  On-going            Plan - 03/15/18 1458    Clinical Impression Statement  Angela Cook noting improving R shoulder/upper arm pain with work station modifications suggested on eval, however increased activity with return to work 2 hrs/day has left her buttocks more irritated. Treatment today focused on manual therapy ,including DN after pt verbal consent, to address increased muscle tension and mutiple TPs t/o R buttock and posterior shoulder complex with decreased muscle tnesion noted following this. Instructed pt in stretches for theses areas to promtoe further reduction in muscle tension and will introduce more strengthening exercises on the next visit to facilitate restoration of normal muscle activity    Rehab Potential  Good    PT Treatment/Interventions  Patient/family education;Manual techniques;Dry needling;Taping;Moist Heat;Cryotherapy;Iontophoresis 4mg /ml Dexamethasone;Ultrasound;Therapeutic exercise;Therapeutic activities;Functional mobility training;Gait training;Neuromuscular re-education;ADLs/Self Care Home Management    Consulted and Agree with Plan  of Care  Patient       Patient will benefit from skilled therapeutic intervention in order to improve the following deficits and impairments:  Pain, Increased muscle spasms, Decreased range of motion, Impaired flexibility, Decreased strength, Difficulty walking, Abnormal gait, Decreased activity tolerance, Improper body mechanics, Postural dysfunction  Visit Diagnosis: Chronic bilateral low  back pain with bilateral sciatica  Pain of right upper arm  Cramp and spasm  Abnormal posture     Problem List Patient Active Problem List   Diagnosis Date Noted  . Closed displaced trimalleolar fracture of lower leg with routine healing 02/17/2018  . Lumbar stenosis with neurogenic claudication 01/07/2016  . Lumbar pseudoarthrosis 07/06/2014    Marry Guan, PT, MPT 03/15/2018, 3:19 PM  [facility] [address]  [address] [place], [place], [address] Phone: [phone]   Fax:  [phone]  Name: Angela Cook MRN: [id number] Date of Birth: 09-21-1955

## 2018-03-15 NOTE — Patient Instructions (Addendum)

## 2018-03-18 ENCOUNTER — Ambulatory Visit: Payer: 59 | Admitting: Physical Therapy

## 2018-03-18 ENCOUNTER — Encounter: Payer: Self-pay | Admitting: Physical Therapy

## 2018-03-18 DIAGNOSIS — R252 Cramp and spasm: Secondary | ICD-10-CM

## 2018-03-18 DIAGNOSIS — M5442 Lumbago with sciatica, left side: Principal | ICD-10-CM

## 2018-03-18 DIAGNOSIS — R293 Abnormal posture: Secondary | ICD-10-CM

## 2018-03-18 DIAGNOSIS — M79621 Pain in right upper arm: Secondary | ICD-10-CM

## 2018-03-18 DIAGNOSIS — G8929 Other chronic pain: Secondary | ICD-10-CM

## 2018-03-18 DIAGNOSIS — M5441 Lumbago with sciatica, right side: Principal | ICD-10-CM

## 2018-03-18 NOTE — Therapy (Signed)
[facility] [address]  [address] [place], [place], [address] Phone: [phone]   Fax:  [phone]  Physical Therapy Treatment  Patient Details  Name: Angela Cook MRN: [id number] Date of Birth: 1955-11-24 Referring Provider: Pricilla HolmSherry Ryter-Brown, MD   Encounter Date: 03/18/2018  PT End of Session - 03/18/18 0840    Visit Number  3    Number of Visits  12    Date for PT Re-Evaluation  04/21/18    Authorization Type  [employer] (no ionto)    PT Start Time  0840    PT Stop Time  0932    PT Time Calculation (min)  52 min    Activity Tolerance  Patient tolerated treatment well    Behavior During Therapy  [facility] for tasks assessed/performed       Past Medical History:  Diagnosis Date  . Arthritis   . Asthma    as a child, no issues since age 89  . Constipation due to opioid therapy   . Fibromyalgia    legs from back   . Pneumonia    as a child    Past Surgical History:  Procedure Laterality Date  . BACK SURGERY  07/05/13, 2015   lumbar 4-5, 2015- new bone graft inserted  . BUNIONECTOMY Left 2014  . CARPAL TUNNEL RELEASE Right 1986   left 1991  . CARPAL TUNNEL RELEASE Left   . CESAREAN SECTION  [phone]  . COLONOSCOPY    . LUMBAR LAMINECTOMY/DECOMPRESSION MICRODISCECTOMY Bilateral 01/07/2016   Procedure: DECOMPRESSIONL LUMBAR THREE-FOUR  WITH FORAMINOTOMIES ;  Surgeon: Barnett AbuHenry Elsner, MD;  Location: [facility];  Service: Neurosurgery;  Laterality: Bilateral;  . ORIF ANKLE FRACTURE Left 02/07/2018   Procedure: OPEN REDUCTION INTERNAL FIXATION (ORIF) LEFT BIMALLEOLAR ANKLE FRACTURE;  Surgeon: Tarry KosXu, Naiping M, MD;  Location: [facility] OR;  Service: Orthopedics;  Laterality: Left;  . SPINAL CORD STIMULATOR IMPLANT    . TUBAL LIGATION  1986    There were no vitals filed for this visit.  Subjective Assessment - 03/18/18 0845    Subjective  Angela Cook reporting increased pain and stiffness, noting that this is more typical in the  mornings.    Pertinent History  L4-5 MIS TLIF with iliac crest bone graft 07/05/2013; Revision of posterior interbody fusion 07/06/14; Bil laminotomies and foraminotomies L3-L4 with decompression of L3 and L4 nerve roots 01/07/16; Thoracic laminectomies with placement of implanted spinal stimulator 05/25/17; L ankle trimalleolar ankle fracture s/p ORIF on 02/07/18    Patient Stated Goals  "reduce pain in R arm and buttocks"    Currently in Pain?  Yes    Pain Score  7     Pain Location  Buttocks    Pain Orientation  Right;Left    Pain Descriptors / Indicators  Throbbing    Pain Score  7    Pain Location  Arm    Pain Orientation  Right;Upper    Pain Descriptors / Indicators  Tightness;Sharp;Throbbing   "intense pulling"   Pain Type  Chronic pain                       [facility] Adult PT Treatment/Exercise - 03/18/18 0840      Exercises   Exercises  Shoulder;Lumbar      Lumbar Exercises: Stretches   Passive Hamstring Stretch  Right;30 seconds;1 rep    Passive Hamstring Stretch Limitations  manual by PT      Lumbar Exercises:  Supine   Clam Limitations  attempted but deferred d/t repeated HS cramping      Lumbar Exercises: Sidelying   Clam  Right;10 reps;3 seconds    Clam Limitations  red TB      Shoulder Exercises: Seated   Extension  Both;10 reps;Theraband;Strengthening    Theraband Level (Shoulder Extension)  Level 2 (Red)    Extension Limitations  Scap retraction with extension to neutral    Row  Both;10 reps;Theraband;Strengthening    Theraband Level (Shoulder Row)  Level 2 (Red)    External Rotation  Both;10 reps;Theraband;Strengthening    Theraband Level (Shoulder External Rotation)  Level 2 (Red)      Manual Therapy   Manual Therapy  Soft tissue mobilization;Myofascial release    Manual therapy comments  prone & supine    Soft tissue mobilization  STM to R glutes, piriformis & HS; R UT, LS, RTC musculature & biceps    Myofascial Release  manual TPR to R prox HS  & R UT       Trigger Point Dry Needling - 03/18/18 0840    Consent Given?  Yes    Education Handout Provided  Yes    Muscles Treated Upper Body  Supraspinatus   & Rt biceps   Muscles Treated Lower Body  Hamstring   Rt   Supraspinatus Response  Twitch response elicited;Palpable increased muscle length    Hamstring Response  Twitch response elicited;Palpable increased muscle length           PT Education - 03/18/18 0930    Education Details  Initial strengthening HEP - red TBs provided    Person(s) Educated  Patient    Methods  Explanation;Demonstration;Handout    Comprehension  Verbalized understanding;Returned demonstration       PT Short Term Goals - 03/15/18 1421      PT SHORT TERM GOAL #1   Title  Independent with initial HEP    Status  On-going      PT SHORT TERM GOAL #2   Title  Pt will verbalize/demonstrate understanding of appropriate posture and body mechanics needed for daily activities including work station set-up    Baseline  Patient to demonstrate     Status  On-going        PT Long Term Goals - 03/15/18 1428      PT LONG TERM GOAL #1   Title  Independent with ongoing/advanced HEP    Status  On-going      PT LONG TERM GOAL #2   Title  Pt will report ability to sit for >/= 30 minutes w/o limitation due to buttock pain to allow for increased focus at work and improved tolerance for driving    Status  On-going      PT LONG TERM GOAL #3   Title  Pt will report ability to complete work tasks on computer inlcuding use of mouse with pain </= 3/10    Status  On-going            Plan - 03/18/18 0932    Clinical Impression Statement  Angela Cook continues to report improving tolerance for manipulating computer mouse at work following recommended work station modifications, but reports she still tends to wake up more stiff/sore in the mornings and continues to have limited sitting tolerance due to pain near ischial tuberosities in upright sitting. Increased  muscle tension/taut bands noted in proximal HS, but pt reports limited tolerance for previous attempts at HS stretching and manual therapy including distal  HS DN due to triggering of severe muscle cramps associated with her lumbar radiculopathy. As such, cautiously approached manual therapy inlcuding DN to proximal HS with good twitch repsonse and no cramping elicited during manual therapy or DN, however initial attempts at hip strengthening with supine clams triggering R HS cramping. Pt was able to complete this exercise is sidelying w/o same response. Less tension noted in shoulder musculature today with only significant taut bands/TPs identified in R supraspinatus and R biceps which were also addressed with manual therapy and DN followed but scapular & RTC strengthening with good tolerance. Initial strengthening HEP provided accordingly.    Rehab Potential  Good    PT Treatment/Interventions  Patient/family education;Manual techniques;Dry needling;Taping;Moist Heat;Cryotherapy;Iontophoresis 4mg /ml Dexamethasone;Ultrasound;Therapeutic exercise;Therapeutic activities;Functional mobility training;Gait training;Neuromuscular re-education;ADLs/Self Care Home Management    Consulted and Agree with Plan of Care  Patient       Patient will benefit from skilled therapeutic intervention in order to improve the following deficits and impairments:  Pain, Increased muscle spasms, Decreased range of motion, Impaired flexibility, Decreased strength, Difficulty walking, Abnormal gait, Decreased activity tolerance, Improper body mechanics, Postural dysfunction  Visit Diagnosis: Chronic bilateral low back pain with bilateral sciatica  Pain of right upper arm  Cramp and spasm  Abnormal posture     Problem List Patient Active Problem List   Diagnosis Date Noted  . Closed displaced trimalleolar fracture of lower leg with routine healing 02/17/2018  . Lumbar stenosis with neurogenic claudication 01/07/2016  .  Lumbar pseudoarthrosis 07/06/2014    Angela Cook, PT, MPT 03/18/2018, 12:53 PM  [facility] [address]  [address] [place], [place], [address] Phone: [phone]   Fax:  [phone]  Name: Angela Cook MRN: [id number] Date of Birth: 04-Nov-1955

## 2018-03-23 ENCOUNTER — Ambulatory Visit: Payer: 59 | Attending: Family Medicine

## 2018-03-23 DIAGNOSIS — R293 Abnormal posture: Secondary | ICD-10-CM | POA: Insufficient documentation

## 2018-03-23 DIAGNOSIS — R262 Difficulty in walking, not elsewhere classified: Secondary | ICD-10-CM | POA: Diagnosis present

## 2018-03-23 DIAGNOSIS — R2689 Other abnormalities of gait and mobility: Secondary | ICD-10-CM | POA: Diagnosis present

## 2018-03-23 DIAGNOSIS — M5442 Lumbago with sciatica, left side: Secondary | ICD-10-CM | POA: Insufficient documentation

## 2018-03-23 DIAGNOSIS — M25572 Pain in left ankle and joints of left foot: Secondary | ICD-10-CM | POA: Diagnosis present

## 2018-03-23 DIAGNOSIS — R29810 Facial weakness: Secondary | ICD-10-CM | POA: Diagnosis present

## 2018-03-23 DIAGNOSIS — M25672 Stiffness of left ankle, not elsewhere classified: Secondary | ICD-10-CM | POA: Diagnosis present

## 2018-03-23 DIAGNOSIS — M79621 Pain in right upper arm: Secondary | ICD-10-CM | POA: Diagnosis present

## 2018-03-23 DIAGNOSIS — M6281 Muscle weakness (generalized): Secondary | ICD-10-CM | POA: Diagnosis present

## 2018-03-23 DIAGNOSIS — G8929 Other chronic pain: Secondary | ICD-10-CM | POA: Insufficient documentation

## 2018-03-23 DIAGNOSIS — R252 Cramp and spasm: Secondary | ICD-10-CM

## 2018-03-23 DIAGNOSIS — M5441 Lumbago with sciatica, right side: Secondary | ICD-10-CM | POA: Insufficient documentation

## 2018-03-23 NOTE — Therapy (Signed)
[facility] [address]  [address] [place], [place], [address] Phone: [phone]   Fax:  [phone]  Physical Therapy Treatment  Patient Details  Name: Angela Cook MRN: [id number] Date of Birth: 02-Feb-1956 Referring Provider: Pricilla Holm, MD   Encounter Date: 03/23/2018  PT End of Session - 03/23/18 0854    Visit Number  4    Number of Visits  12    Date for PT Re-Evaluation  04/21/18    Authorization Type  Aetna (no ionto)    PT Start Time  [phone]    PT Stop Time  0930    PT Time Calculation (min)  44 min    Activity Tolerance  Patient tolerated treatment well    Behavior During Therapy  [facility] for tasks assessed/performed       Past Medical History:  Diagnosis Date  . Arthritis   . Asthma    as a child, no issues since age 1  . Constipation due to opioid therapy   . Fibromyalgia    legs from back   . Pneumonia    as a child    Past Surgical History:  Procedure Laterality Date  . BACK SURGERY  07/05/13, 2015   lumbar 4-5, 2015- new bone graft inserted  . BUNIONECTOMY Left 2014  . CARPAL TUNNEL RELEASE Right 1986   left 1991  . CARPAL TUNNEL RELEASE Left   . CESAREAN SECTION  [phone]  . COLONOSCOPY    . LUMBAR LAMINECTOMY/DECOMPRESSION MICRODISCECTOMY Bilateral 01/07/2016   Procedure: DECOMPRESSIONL LUMBAR THREE-FOUR  WITH FORAMINOTOMIES ;  Surgeon: Barnett Abu, MD;  Location: [facility];  Service: Neurosurgery;  Laterality: Bilateral;  . ORIF ANKLE FRACTURE Left 02/07/2018   Procedure: OPEN REDUCTION INTERNAL FIXATION (ORIF) LEFT BIMALLEOLAR ANKLE FRACTURE;  Surgeon: Tarry Kos, MD;  Location: [facility];  Service: Orthopedics;  Laterality: Left;  . SPINAL CORD STIMULATOR IMPLANT    . TUBAL LIGATION  1986    There were no vitals filed for this visit.  Subjective Assessment - 03/23/18 0848    Subjective  Pt. noting good benefit from DN last visit. Pt. noting f/u upcoming with MD on Friday.       Pertinent History  Pt. noting improvement in shoulder and hamstring following DN.      How long can you walk comfortably?  not normally limited; currently limited due to L LE NWB requiring use of knee scooter    Patient Stated Goals  "reduce pain in R arm and buttocks"    Currently in Pain?  Yes    Pain Score  6     Pain Location  Buttocks    Pain Orientation  Right;Left    Pain Descriptors / Indicators  Throbbing    Pain Type  Chronic pain    Pain Onset  More than a month ago    Pain Frequency  Constant    Aggravating Factors   Prolonged sitting     Pain Relieving Factors  In recliner, walking     Pain Score  7    Pain Location  Arm    Pain Orientation  Right    Pain Descriptors / Indicators  Aching   "deep", "pulling"   Pain Type  Chronic pain    Pain Radiating Towards  none today    Pain Onset  More than a month ago    Pain Frequency  Intermittent    Aggravating Factors   using computer  mouse     Pain Relieving Factors  [address]                        [facility] Adult PT Treatment/Exercise - 03/23/18 0919      Lumbar Exercises: Stretches   Passive Hamstring Stretch  Right;3 reps;20 seconds    Passive Hamstring Stretch Limitations  3-way supine HS stretch with strap, seated       Lumbar Exercises: Sidelying   Clam  3 seconds;Right   x 12 reps    Clam Limitations  red TB      Shoulder Exercises: Seated   Extension  Both;Theraband;Strengthening   x 12 reps   Theraband Level (Shoulder Extension)  Level 2 (Red)    Extension Limitations  Scap retraction with extension to neutral    Row  15 reps;Theraband;Both;Strengthening    Theraband Level (Shoulder Row)  Level 2 (Red)    External Rotation  Both;10 reps;Theraband;Strengthening    Theraband Level (Shoulder External Rotation)  Level 2 (Red)      Shoulder Exercises: Stretch   Other Shoulder Stretches  R UT & LS stretches 2 x 30 sec      Manual Therapy   Manual Therapy  Soft tissue mobilization     Manual therapy comments  sidelying     Soft tissue mobilization  STM to R glute, proximal HS area; palpable TP's in proximal R HS     Myofascial Release  TPR to R proximal HS - pt. noting relief following this               PT Short Term Goals - 03/15/18 1421      PT SHORT TERM GOAL #1   Title  Independent with initial HEP    Status  On-going      PT SHORT TERM GOAL #2   Title  Pt will verbalize/demonstrate understanding of appropriate posture and body mechanics needed for daily activities including work station set-up    Baseline  Patient to demonstrate     Status  On-going        PT Long Term Goals - 03/15/18 1428      PT LONG TERM GOAL #1   Title  Independent with ongoing/advanced HEP    Status  On-going      PT LONG TERM GOAL #2   Title  Pt will report ability to sit for >/= 30 minutes w/o limitation due to buttock pain to allow for increased focus at work and improved tolerance for driving    Status  On-going      PT LONG TERM GOAL #3   Title  Pt will report ability to complete work tasks on computer inlcuding use of mouse with pain </= 3/10    Status  On-going            Plan - 03/23/18 0856    Clinical Impression Statement  Pt. doing well today.  Notes good benefit from DN last visit.  Primary complaint today is tenderness in R buttocks/proximal HS which was addressed with manual therapy with good relief noted following this.  Duration of visit focused on HS and postural strengthening activities to review HEP and check for proper technique.  Pt. required cueing for full scapular retraction and to avoid excessive scapular elevation with HEP review today however able to demo good technique following review.  Ended visit with pt. noting decreased pain and reduction in UT tone.  Will continue to  progress toward goals.      PT Treatment/Interventions  Patient/family education;Manual techniques;Dry needling;Taping;Moist Heat;Cryotherapy;Iontophoresis 4mg /ml  Dexamethasone;Ultrasound;Therapeutic exercise;Therapeutic activities;Functional mobility training;Gait training;Neuromuscular re-education;ADLs/Self Care Home Management    Consulted and Agree with Plan of Care  Patient       Patient will benefit from skilled therapeutic intervention in order to improve the following deficits and impairments:  Pain, Increased muscle spasms, Decreased range of motion, Impaired flexibility, Decreased strength, Difficulty walking, Abnormal gait, Decreased activity tolerance, Improper body mechanics, Postural dysfunction  Visit Diagnosis: Chronic bilateral low back pain with bilateral sciatica  Pain of right upper arm  Cramp and spasm  Abnormal posture     Problem List Patient Active Problem List   Diagnosis Date Noted  . Closed displaced trimalleolar fracture of lower leg with routine healing 02/17/2018  . Lumbar stenosis with neurogenic claudication 01/07/2016  . Lumbar pseudoarthrosis 07/06/2014    Kermit Balo, PTA 03/23/18 3:48 PM    [facility] [address]  [address] [place], [place], [address] Phone: [phone]   Fax:  [phone]  Name: Angela Cook MRN: [id number] Date of Birth: 10-26-1955

## 2018-03-25 ENCOUNTER — Ambulatory Visit (INDEPENDENT_AMBULATORY_CARE_PROVIDER_SITE_OTHER): Payer: 59

## 2018-03-25 ENCOUNTER — Ambulatory Visit (INDEPENDENT_AMBULATORY_CARE_PROVIDER_SITE_OTHER): Payer: 59 | Admitting: Orthopaedic Surgery

## 2018-03-25 DIAGNOSIS — S82852D Displaced trimalleolar fracture of left lower leg, subsequent encounter for closed fracture with routine healing: Secondary | ICD-10-CM

## 2018-03-25 NOTE — Progress Notes (Signed)
   Post-Op Visit Note   Patient: Angela Cook           Date of Birth: 27-Jun-1956           MRN: [id number] Visit Date: 03/25/2018 PCP: Deloris Ping, MD   Assessment & Plan:  Chief Complaint:  Chief Complaint  Patient presents with  . Left Ankle - Routine Post Op   Visit Diagnoses:  1. Closed displaced trimalleolar fracture of left ankle with routine healing, subsequent encounter     Plan: Kaia is approximately 6 weeks status post ORIF left bimalleolar ankle fracture.  She is doing well no weightbearing or scooter.  Her surgical scars are healed.  The medial scar is adhered.  There is no signs of infection or compromise.  X-rays demonstrate significant healing of the fracture and stable alignment.  At this point we will advance her to weight-bear as tolerated a Cam walker.  Referral to physical therapy was made.  Scar mobilization especially for the medial scar.  Recheck in 6 weeks with three-view x-rays of the left ankle.  Follow-Up Instructions: Return in about 6 weeks (around 05/06/2018).   Orders:  Orders Placed This Encounter  Procedures  . XR Ankle Complete Left   No orders of the defined types were placed in this encounter.   Imaging: Xr Ankle Complete Left  Result Date: 03/25/2018 Stable fixation of bimalleolar ankle fracture.  Fractures appear to be healing very well.   PMFS History: Patient Active Problem List   Diagnosis Date Noted  . Closed displaced trimalleolar fracture of lower leg with routine healing 02/17/2018  . Lumbar stenosis with neurogenic claudication 01/07/2016  . Lumbar pseudoarthrosis 07/06/2014   Past Medical History:  Diagnosis Date  . Arthritis   . Asthma    as a child, no issues since age 15  . Constipation due to opioid therapy   . Fibromyalgia    legs from back   . Pneumonia    as a child    No family history on file.  Past Surgical History:  Procedure Laterality Date  . BACK SURGERY  07/05/13, 2015   lumbar 4-5, 2015-  new bone graft inserted  . BUNIONECTOMY Left 2014  . CARPAL TUNNEL RELEASE Right 1986   left 1991  . CARPAL TUNNEL RELEASE Left   . CESAREAN SECTION  [phone]  . COLONOSCOPY    . LUMBAR LAMINECTOMY/DECOMPRESSION MICRODISCECTOMY Bilateral 01/07/2016   Procedure: DECOMPRESSIONL LUMBAR THREE-FOUR  WITH FORAMINOTOMIES ;  Surgeon: Barnett Abu, MD;  [facility]: [facility];  Service: Neurosurgery;  Laterality: Bilateral;  . ORIF ANKLE FRACTURE Left 02/07/2018   Procedure: OPEN REDUCTION INTERNAL FIXATION (ORIF) LEFT BIMALLEOLAR ANKLE FRACTURE;  Surgeon: Tarry Kos, MD;  Location: [facility];  Service: Orthopedics;  Laterality: Left;  . SPINAL CORD STIMULATOR IMPLANT    . TUBAL LIGATION  1986   Social History   Occupational History  . Not on file  Tobacco Use  . Smoking status: Never Smoker  . Smokeless tobacco: Never Used  Substance and Sexual Activity  . Alcohol use: No    Frequency: Never  . Drug use: No  . Sexual activity: Not on file

## 2018-03-28 ENCOUNTER — Other Ambulatory Visit: Payer: Self-pay

## 2018-03-28 ENCOUNTER — Ambulatory Visit: Payer: 59 | Admitting: Physical Therapy

## 2018-03-28 DIAGNOSIS — M25572 Pain in left ankle and joints of left foot: Secondary | ICD-10-CM

## 2018-03-28 DIAGNOSIS — M5441 Lumbago with sciatica, right side: Secondary | ICD-10-CM

## 2018-03-28 DIAGNOSIS — M79621 Pain in right upper arm: Secondary | ICD-10-CM

## 2018-03-28 DIAGNOSIS — R262 Difficulty in walking, not elsewhere classified: Secondary | ICD-10-CM

## 2018-03-28 DIAGNOSIS — G8929 Other chronic pain: Secondary | ICD-10-CM

## 2018-03-28 DIAGNOSIS — R2689 Other abnormalities of gait and mobility: Secondary | ICD-10-CM

## 2018-03-28 DIAGNOSIS — R293 Abnormal posture: Secondary | ICD-10-CM

## 2018-03-28 DIAGNOSIS — M25672 Stiffness of left ankle, not elsewhere classified: Secondary | ICD-10-CM

## 2018-03-28 DIAGNOSIS — R252 Cramp and spasm: Secondary | ICD-10-CM

## 2018-03-28 DIAGNOSIS — R29810 Facial weakness: Secondary | ICD-10-CM

## 2018-03-28 DIAGNOSIS — M5442 Lumbago with sciatica, left side: Secondary | ICD-10-CM

## 2018-03-28 NOTE — Therapy (Signed)
[facility] [address]  [address] [place], [place], [address] Phone: [phone]   Fax:  [phone]  Physical Therapy Evaluation  Patient Details  Name: Angela Cook MRN: [id number] Date of Birth: 1956-05-05 Referring Provider: N. Glee Arvin, MD   Encounter Date: 03/28/2018  PT End of Session - 03/28/18 0844    Visit Number  5    Number of Visits  25    Date for PT Re-Evaluation  05/13/18    Authorization Type  A[employer] (no ionto)    PT Start Time  [phone]    PT Stop Time  0944    PT Time Calculation (min)  60 min    Activity Tolerance  Patient tolerated treatment well    Behavior During Therapy  [facility] for tasks assessed/performed       Past Medical History:  Diagnosis Date  . Arthritis   . Asthma    as a child, no issues since age 62  . Constipation due to opioid therapy   . Fibromyalgia    legs from back   . Pneumonia    as a child    Past Surgical History:  Procedure Laterality Date  . BACK SURGERY  07/05/13, 2015   lumbar 4-5, 2015- new bone graft inserted  . BUNIONECTOMY Left 2014  . CARPAL TUNNEL RELEASE Right 1986   left 1991  . CARPAL TUNNEL RELEASE Left   . CESAREAN SECTION  [phone]  . COLONOSCOPY    . LUMBAR LAMINECTOMY/DECOMPRESSION MICRODISCECTOMY Bilateral 01/07/2016   Procedure: DECOMPRESSIONL LUMBAR THREE-FOUR  WITH FORAMINOTOMIES ;  Surgeon: Barnett Abu, MD;  Location: [facility];  Service: Neurosurgery;  Laterality: Bilateral;  . ORIF ANKLE FRACTURE Left 02/07/2018   Procedure: OPEN REDUCTION INTERNAL FIXATION (ORIF) LEFT BIMALLEOLAR ANKLE FRACTURE;  Surgeon: Tarry Kos, MD;  Location: [facility];  Service: Orthopedics;  Laterality: Left;  . SPINAL CORD STIMULATOR IMPLANT    . TUBAL LIGATION  1986    There were no vitals filed for this visit.   Subjective Assessment - 03/28/18 0847    Subjective  On 02/01/18 pt fell while walking down a grassy slope when leaving work, resulting in a  trimalleolar ankle fracture. Casted initially and underwent surgical ORIF on 02/07/18. Initially NWB post-op until MD f/u on 03/25/18 when she was progressed to WBAT in CAM boot and was told she could wean from CAM boot as tolerated. Py reporting difficulty walking in CAM boot due to what she feels like is plantar fasciitis and arrives to PT today in Birkenstock sandals (only comfortable shoes). Also currently receiving PT for chronic low back and shoulder/upper arm pain which she feels was exacerbated by use of knee scooter while NWB on L LE.  Work schedule remains part-time but increasing from 2 to 4 hrs/day as of today.    Pertinent History  L ankle trimalleolar ankle fracture s/p ORIF on 02/07/18; L4-5 MIS TLIF with iliac crest bone graft 07/05/2013; Revision of posterior interbody fusion 07/06/14; Bil laminotomies and foraminotomies L3-L4 with decompression of L3 and L4 nerve roots 01/07/16; Thoracic laminectomies with placement of implanted spinal stimulator 05/25/17    Patient Stated Goals  "reduce pain in R arm and buttocks" & "walk normally"    Currently in Pain?  Yes    Pain Score  6     Pain Location  Foot   plantar aspect of heel   Pain Orientation  Left;Lower    Pain Descriptors /  Indicators  Pressure    Pain Type  Acute pain    Pain Onset  In the past 7 days    Pain Frequency  Intermittent    Aggravating Factors   resuming walking after prolonged immobility & NWB on L    Pain Relieving Factors  ice water bottle massage, ice pack, massage, Motrin    Effect of Pain on Daily Activities  very slow & cautious, limited standing & walking tolerance, unable to navigate stairs, unable to ambulate on uneven surfaces. limited tolerance household chores    Pain Score  4    Pain Location  Buttocks    Pain Orientation  Left;Right    Pain Descriptors / Indicators  Pressure    Pain Type  Chronic pain    Pain Onset  More than a month ago    Pain Frequency  Constant    Pain Score  2    Pain Location   Arm    Pain Orientation  Right    Pain Descriptors / Indicators  Aching   & pulling   Pain Type  Chronic pain    Pain Onset  More than a month ago    Pain Frequency  Intermittent         [facility] PT Assessment - 03/28/18 0844      Assessment   Medical Diagnosis  L ankle ORIF for trimalleolar fracture    Referring Provider  N. Glee Arvin, MD    Onset Date/Surgical Date  02/01/18    Next MD Visit  05/06/18    Prior Therapy  currently receving PT for low back and arm pain      Restrictions   LLE Weight Bearing  Weight bearing as tolerated      Balance Screen   Has the patient fallen in the past 6 months  Yes    How many times?  1   at time of injury   Has the patient had a decrease in activity level because of a fear of falling?   Yes    Is the patient reluctant to leave their home because of a fear of falling?   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    Leisure  walk 2 miles/day, playing with grandkids, cooking      Sensation   Light Touch  Impaired by gross assessment    Additional Comments  numbness in L lateral foot      ROM / Strength   AROM / PROM / Strength  AROM;Strength      AROM   AROM Assessment Site  Ankle    Right/Left Ankle  Right;Left    Right Ankle Dorsiflexion  10    Right Ankle Plantar Flexion  59    Right Ankle  Inversion  31    Right Ankle Eversion  26    Left Ankle Dorsiflexion  -4     Left Ankle Plantar Flexion  25    Left Ankle Inversion  17    Left Ankle Eversion  11      Strength   Strength Assessment Site  Ankle;Knee;Hip    Right/Left Hip  Right;Left    Right Hip Flexion  4/5    Right Hip Extension  4-/5    Right Hip External Rotation   4-/5    Right Hip Internal Rotation  4/5    Right Hip ABduction  4/5    Right Hip ADduction  4/5    Left Hip Flexion  3+/5    Left Hip Extension  4/5    Left Hip External Rotation  3+/5    Left Hip Internal Rotation  4-/5    Left Hip ABduction  4-/5    Left Hip ADduction  4-/5    Right/Left Knee  Right;Left    Right Knee Flexion  4+/5    Right Knee Extension  4+/5    Left Knee Flexion  4/5    Left Knee Extension  4/5    Right/Left Ankle  Right;Left    Right Ankle Dorsiflexion  4+/5    Right Ankle Plantar Flexion  4/5    Right Ankle Inversion  4/5    Right Ankle Eversion  4+/5    Left Ankle Dorsiflexion  2+/5    Left Ankle Plantar Flexion  3-/5    Left Ankle Inversion  3+/5    Left Ankle Eversion  2+/5      Flexibility   Soft Tissue Assessment /Muscle Length  yes    Hamstrings  mild tightness    Quadriceps  WFL    ITB  WFL    Piriformis  mild tightness      Ambulation/Gait   Assistive device  Straight cane    Gait Pattern  Step-through pattern;Decreased dorsiflexion - left;Decreased weight shift to left;Decreased stance time - left;Lateral trunk lean to right    Ambulation Surface  Level;Unlevel                Objective measurements completed on examination: See above findings.      [facility] Adult PT Treatment/Exercise - 03/28/18 0844      Exercises   Exercises  Ankle      Ankle Exercises: Stretches   Plantar Fascia Stretch  30 seconds;3 reps    Plantar Fascia Stretch Limitations  seated    Soleus Stretch  30 seconds;3 reps    Soleus Stretch Limitations  long sitting with towel - slight knee flexion    Gastroc Stretch  30 seconds;3 reps    Gastroc Stretch Limitations  long sitting with towel       Ankle Exercises: Seated   ABC's  1 rep    Ankle Circles/Pumps  Left;AROM;10 reps   each   Other Seated Ankle Exercises  L ankle inversion/eversion x10             PT Education - 03/28/18 0932    Education Details  PT eval findings, anticipated POC & initial HEP    Person(s) Educated  Patient    Methods  Explanation;Demonstration;Handout    Comprehension  Verbalized understanding;Returned demonstration       PT Short Term Goals - 03/15/18 1421      PT SHORT TERM GOAL #1   Title  Independent with initial HEP  Status  On-going      PT SHORT TERM GOAL #2   Title  Pt will verbalize/demonstrate understanding of appropriate posture and body mechanics needed for daily activities including work station set-up    Baseline  Patient to demonstrate     Status  On-going        PT Long Term Goals - 03/28/18 0945      PT LONG TERM GOAL #1   Title  Independent with ongoing/advanced HEP    Status  On-going    Target Date  05/13/18      PT LONG TERM GOAL #2   Title  Pt will report ability to sit for >/= 30 minutes w/o limitation due to buttock pain to allow for increased focus at work and improved tolerance for driving    Status  On-going    Target Date  05/13/18      PT LONG TERM GOAL #3   Title  Pt will report ability to complete work tasks on computer inlcuding use of mouse with pain </= 3/10    Status  On-going    Target Date  05/13/18      PT LONG TERM GOAL #4   Title  L ankle ROM WFL w/o increased pain to allow for normal gait pattern    Status  New    Target Date  05/13/18      PT LONG TERM GOAL #5   Title  L ankle strength >/= 4/5 for improved gait stability    Status  New    Target Date  05/13/18      PT LONG TERM GOAL #6   Title  Pt will ambulate with normal gait pattern w/o AD w/o evidence of L ankle instability    Status  New    Target Date  05/13/18      PT LONG TERM GOAL #7   Title  Pt will return to full-time status at work w/o limitation due L  ankle LOM, weakness or pain    Status  New    Target Date  05/13/18             Plan - 03/28/18 0944    Clinical Impression Statement  Yi is a 62 y/o female who presents to OP PT 7 wks s/p ORIF on 02/07/18 for a L trimalleolar fracture suffered on 02/01/18 with orders for strengthening and gait training WBAT weaning from CAM boot. Pt also currently receiving PT for chronic low back and shoulder/upper arm pain exacerbated by use of knee scooter while NWB on L LE. Pt recently progressed to WBAT in CAM walking boot on L as of MD f/u on 03/25/18 with instructions to wean from CAM as tolerated. Pt arrives to PT in Birkenstock sandals w/o Cam boot walking with SPC, reporting that these are the only shoes she can walk comfortably in as she feels like being in the CAM boot has resulted in plantar fasciitis.  Pt reports only pain in foot/ankle at time of eval is in plantar surface of L foot primarily over calcaneal tubercle. Mild edema present with L ankle along with significant scar tissue adhesion in anterior incisional scar. L ankle ROM significantly limited as compared to R. Moderate L ankle weakness noted, along with some mild to moderate proximal LE weakness. Pt will benefit from skilled PT to address deficits listed to restore functional L ankle ROM, proprioception and strength for improved balance and gait stability/tolerance. Given concurrent PT for low back and arm  pain, will increase frequency to 3x/wk with plan for 2 vists per week focusing on ankle and remaining visit focusing on arm and low back.    History and Personal Factors relevant to plan of care:  L ankle trimalleolar ankle fracture s/p ORIF on 02/07/18; L4-5 MIS TLIF with iliac crest bone graft 07/05/2013; Revision of posterior interbody fusion 07/06/14; Bil laminotomies and foraminotomies L3-L4 with decompression of L3 and L4 nerve roots 01/07/16; Thoracic laminectomies with placement of implanted spinal stimulator 05/25/17    Clinical  Presentation  Evolving    Clinical Presentation due to:  new c/o plantar fascia pain with initiation of WBAT on L; medical complexity + cormorbidities of chronic low back pain s/p mutiple surgeries and acute R arm pain    Clinical Decision Making  Moderate    Rehab Potential  Good    PT Frequency  3x / week   2x/wk for L ankle; 1x/wk for arm & low back pain (ongoing POC)   PT Duration  8 weeks   6-8 weeks   PT Treatment/Interventions  Patient/family education;Manual techniques;Passive range of motion;Dry needling;Taping;Scar mobilization;Neuromuscular re-education;Therapeutic exercise;Therapeutic activities;Functional mobility training;Gait training;Stair training;Balance training;ADLs/Self Care Home Management;Ultrasound;Moist Heat;Electrical Stimulation;Cryotherapy;Vasopneumatic Device;Iontophoresis 4mg /ml Dexamethasone    Consulted and Agree with Plan of Care  Patient       Patient will benefit from skilled therapeutic intervention in order to improve the following deficits and impairments:  Pain, Increased muscle spasms, Impaired flexibility, Hypomobility, Decreased range of motion, Decreased strength, Difficulty walking, Abnormal gait, Decreased activity tolerance, Decreased endurance, Decreased scar mobility, Postural dysfunction, Improper body mechanics  Visit Diagnosis: Stiffness of left ankle, not elsewhere classified  Pain in left ankle and joints of left foot  Difficulty in walking, not elsewhere classified  Other abnormalities of gait and mobility  Facial weakness  Chronic bilateral low back pain with bilateral sciatica  Pain of right upper arm  Cramp and spasm  Abnormal posture     Problem List Patient Active Problem List   Diagnosis Date Noted  . Closed displaced trimalleolar fracture of lower leg with routine healing 02/17/2018  . Lumbar stenosis with neurogenic claudication 01/07/2016  . Lumbar pseudoarthrosis 07/06/2014    Marry Guan, PT,  MPT 03/28/2018, 12:39 PM  [facility] [address]  [address] [place], [place], [address] Phone: [phone]   Fax:  [phone]  Name: Angela Cook MRN: [id number] Date of Birth: September 19, 1955

## 2018-03-29 ENCOUNTER — Ambulatory Visit: Payer: 59 | Admitting: Physical Therapy

## 2018-03-29 DIAGNOSIS — M5442 Lumbago with sciatica, left side: Secondary | ICD-10-CM | POA: Diagnosis not present

## 2018-03-29 DIAGNOSIS — M25672 Stiffness of left ankle, not elsewhere classified: Secondary | ICD-10-CM

## 2018-03-29 DIAGNOSIS — R262 Difficulty in walking, not elsewhere classified: Secondary | ICD-10-CM

## 2018-03-29 DIAGNOSIS — M25572 Pain in left ankle and joints of left foot: Secondary | ICD-10-CM

## 2018-03-29 NOTE — Therapy (Signed)
[facility] [address]  [address] [place], [place], [address] Phone: [phone]   Fax:  [phone]  Physical Therapy Treatment  Patient Details  Name: Angela Cook MRN: [id number] Date of Birth: [birth year] Referring Provider: N. Glee Arvin, MD   Encounter Date: 03/29/2018  PT End of Session - 03/29/18 0846    Visit Number  6    Number of Visits  25    Date for PT Re-Evaluation  05/13/18    Authorization Type  Aetna (no ionto)    PT Start Time  [phone]    PT Stop Time  0946    PT Time Calculation (min)  60 min    Activity Tolerance  Patient tolerated treatment well    Behavior During Therapy  [facility] for tasks assessed/performed       Past Medical History:  Diagnosis Date  . Arthritis   . Asthma    as a child, no issues since age 67  . Constipation due to opioid therapy   . Fibromyalgia    legs from back   . Pneumonia    as a child    Past Surgical History:  Procedure Laterality Date  . BACK SURGERY  07/05/13, 2015   lumbar 4-5, 2015- new bone graft inserted  . BUNIONECTOMY Left 2014  . CARPAL TUNNEL RELEASE Right 1986   left 1991  . CARPAL TUNNEL RELEASE Left   . CESAREAN SECTION  [phone]  . COLONOSCOPY    . LUMBAR LAMINECTOMY/DECOMPRESSION MICRODISCECTOMY Bilateral 01/07/2016   Procedure: DECOMPRESSIONL LUMBAR THREE-FOUR  WITH FORAMINOTOMIES ;  Surgeon: Barnett Abu, MD;  Location: [facility];  Service: Neurosurgery;  Laterality: Bilateral;  . ORIF ANKLE FRACTURE Left 02/07/2018   Procedure: OPEN REDUCTION INTERNAL FIXATION (ORIF) LEFT BIMALLEOLAR ANKLE FRACTURE;  Surgeon: Tarry Kos, MD;  Location: [facility];  Service: Orthopedics;  Laterality: Left;  . SPINAL CORD STIMULATOR IMPLANT    . TUBAL LIGATION  1986    There were no vitals filed for this visit.  Subjective Assessment - 03/29/18 0846    Subjective  As per pt yesterday: On 02/01/18 pt fell while walking down a grassy slope when leaving work,  resulting in a trimalleolar ankle fracture. Casted initially and underwent surgical ORIF on 02/07/18. Initially NWB post-op until MD f/u on 03/25/18 when she was progressed to WBAT in CAM boot and was told she could wean from CAM boot as tolerated. Py reporting difficulty walking in CAM boot due to what she feels like is plantar fasciitis and arrives to PT today in Birkenstock sandals (only comfortable shoes). Also currently receiving PT for chronic low back and shoulder/upper arm pain which she feels was exacerbated by use of knee scooter while NWB on L LE.      Pertinent History  L ankle trimalleolar ankle fracture s/p ORIF on 02/07/18; L4-5 MIS TLIF with iliac crest bone graft 07/05/2013; Revision of posterior interbody fusion 07/06/14; Bil laminotomies and foraminotomies L3-L4 with decompression of L3 and L4 nerve roots 01/07/16; Thoracic laminectomies with placement of implanted spinal stimulator 05/25/17    Limitations  Sitting;Standing;House hold activities;Walking    How long can you sit comfortably?  5 minutes    How long can you stand comfortably?  5 minutes    How long can you walk comfortably?  not normally limited; currently limited due to L LE NWB requiring use of knee scooter    Patient Stated Goals  "reduce pain in R  arm and buttocks" & "walk normally"    Currently in Pain?  Yes    Pain Score  5     Pain Orientation  Left;Lower    Pain Descriptors / Indicators  Pressure    Pain Type  Acute pain                       [facility] Adult PT Treatment/Exercise - 03/29/18 0001      Modalities   Modalities  Vasopneumatic      Vasopneumatic   Number Minutes Vasopneumatic   15 minutes    Vasopnuematic Location   Ankle    Vasopneumatic Pressure  Medium    Vasopneumatic Temperature   34      Manual Therapy   Manual Therapy  Soft tissue mobilization;Myofascial release;Passive ROM;Joint mobilization    Joint Mobilization  gd I/II for pain in talocrural    Soft tissue mobilization   to L peroneals, ant tib     Myofascial Release  scar massage    Passive ROM  into DF      Ankle Exercises: Seated   ABC's  1 rep    Ankle Circles/Pumps  Left;AROM;10 reps   each   Heel Raises  Both;20 reps   seated   Other Seated Ankle Exercises  L ankle inversion/eversion x10    Other Seated Ankle Exercises  rockerboard pf/df, inv/ever, circles x 20 ea      Ankle Exercises: Stretches   Plantar Fascia Stretch  2 reps;60 seconds    Plantar Fascia Stretch Limitations  seated    Soleus Stretch  2 reps;60 seconds    Soleus Stretch Limitations  long sitting with towel - slight knee flexion    Gastroc Stretch  2 reps;60 seconds    Gastroc Stretch Limitations  long sitting with towel               PT Short Term Goals - 03/15/18 1421      PT SHORT TERM GOAL #1   Title  Independent with initial HEP    Status  On-going      PT SHORT TERM GOAL #2   Title  Pt will verbalize/demonstrate understanding of appropriate posture and body mechanics needed for daily activities including work station set-up    Baseline  Patient to demonstrate     Status  On-going        PT Long Term Goals - 03/28/18 0945      PT LONG TERM GOAL #1   Title  Independent with ongoing/advanced HEP    Status  On-going    Target Date  05/13/18      PT LONG TERM GOAL #2   Title  Pt will report ability to sit for >/= 30 minutes w/o limitation due to buttock pain to allow for increased focus at work and improved tolerance for driving    Status  On-going    Target Date  05/13/18      PT LONG TERM GOAL #3   Title  Pt will report ability to complete work tasks on computer inlcuding use of mouse with pain </= 3/10    Status  On-going    Target Date  05/13/18      PT LONG TERM GOAL #4   Title  L ankle ROM WFL w/o increased pain to allow for normal gait pattern    Status  New    Target Date  05/13/18      PT LONG TERM  GOAL #5   Title  L ankle strength >/= 4/5 for improved gait stability    Status  New     Target Date  05/13/18      PT LONG TERM GOAL #6   Title  Pt will ambulate with normal gait pattern w/o AD w/o evidence of L ankle instability    Status  New    Target Date  05/13/18      PT LONG TERM GOAL #7   Title  Pt will return to full-time status at work w/o limitation due L ankle LOM, weakness or pain    Status  New    Target Date  05/13/18            Plan - 03/29/18 1541    Clinical Impression Statement  Patient did fairly well with treatment today. She tolerated aggressive manual therapy with some increased pain.     PT Treatment/Interventions  Patient/family education;Manual techniques;Passive range of motion;Dry needling;Taping;Scar mobilization;Neuromuscular re-education;Therapeutic exercise;Therapeutic activities;Functional mobility training;Gait training;Stair training;Balance training;ADLs/Self Care Home Management;Ultrasound;Moist Heat;Electrical Stimulation;Cryotherapy;Vasopneumatic Device;Iontophoresis 4mg /ml Dexamethasone    PT Next Visit Plan  continue ankle 2x/wk, and shoulder back 1x/wk.       Patient will benefit from skilled therapeutic intervention in order to improve the following deficits and impairments:  Pain, Increased muscle spasms, Impaired flexibility, Hypomobility, Decreased range of motion, Decreased strength, Difficulty walking, Abnormal gait, Decreased activity tolerance, Decreased endurance, Decreased scar mobility, Postural dysfunction, Improper body mechanics  Visit Diagnosis: Stiffness of left ankle, not elsewhere classified  Pain in left ankle and joints of left foot  Difficulty in walking, not elsewhere classified     Problem List Patient Active Problem List   Diagnosis Date Noted  . Closed displaced trimalleolar fracture of lower leg with routine healing 02/17/2018  . Lumbar stenosis with neurogenic claudication 01/07/2016  . Lumbar pseudoarthrosis 07/06/2014    Kristopher Delk PT 03/29/2018, 3:45 PM  [facility] [address]  [address] [place], [place], [address] Phone: [phone]   Fax:  [phone]  Name: KATLYNE BRIDENBAUGH MRN: [id number] Date of Birth: 10-Oct-1955

## 2018-03-31 ENCOUNTER — Ambulatory Visit: Payer: 59

## 2018-03-31 DIAGNOSIS — M5441 Lumbago with sciatica, right side: Secondary | ICD-10-CM

## 2018-03-31 DIAGNOSIS — M5442 Lumbago with sciatica, left side: Secondary | ICD-10-CM | POA: Diagnosis not present

## 2018-03-31 DIAGNOSIS — M79621 Pain in right upper arm: Secondary | ICD-10-CM

## 2018-03-31 DIAGNOSIS — R2689 Other abnormalities of gait and mobility: Secondary | ICD-10-CM

## 2018-03-31 DIAGNOSIS — G8929 Other chronic pain: Secondary | ICD-10-CM

## 2018-03-31 DIAGNOSIS — R29810 Facial weakness: Secondary | ICD-10-CM

## 2018-03-31 DIAGNOSIS — R252 Cramp and spasm: Secondary | ICD-10-CM

## 2018-03-31 DIAGNOSIS — R262 Difficulty in walking, not elsewhere classified: Secondary | ICD-10-CM

## 2018-03-31 DIAGNOSIS — M25572 Pain in left ankle and joints of left foot: Secondary | ICD-10-CM

## 2018-03-31 DIAGNOSIS — R293 Abnormal posture: Secondary | ICD-10-CM

## 2018-03-31 DIAGNOSIS — M25672 Stiffness of left ankle, not elsewhere classified: Secondary | ICD-10-CM

## 2018-03-31 NOTE — Therapy (Signed)
[facility] [address]  [address] [place], [place], [address] Phone: [phone]   Fax:  [phone]  Physical Therapy Treatment  Patient Details  Name: Angela Cook MRN: [id number] Date of Birth: [birth year] Referring Provider: N. Glee Arvin, MD   Encounter Date: 03/31/2018  PT End of Session - 03/31/18 0938    Visit Number  7    Number of Visits  25    Date for PT Re-Evaluation  05/13/18    Authorization Type  [employer] (no ionto)    PT Start Time  0930    PT Stop Time  1015    PT Time Calculation (min)  45 min    Activity Tolerance  Patient tolerated treatment well    Behavior During Therapy  [facility] for tasks assessed/performed       Past Medical History:  Diagnosis Date  . Arthritis   . Asthma    as a child, no issues since age 46  . Constipation due to opioid therapy   . Fibromyalgia    legs from back   . Pneumonia    as a child    Past Surgical History:  Procedure Laterality Date  . BACK SURGERY  07/05/13, 2015   lumbar 4-5, 2015- new bone graft inserted  . BUNIONECTOMY Left 2014  . CARPAL TUNNEL RELEASE Right 1986   left 1991  . CARPAL TUNNEL RELEASE Left   . CESAREAN SECTION  [phone]  . COLONOSCOPY    . LUMBAR LAMINECTOMY/DECOMPRESSION MICRODISCECTOMY Bilateral 01/07/2016   Procedure: DECOMPRESSIONL LUMBAR THREE-FOUR  WITH FORAMINOTOMIES ;  Surgeon: Barnett Abu, MD;  Location: [facility];  Service: Neurosurgery;  Laterality: Bilateral;  . ORIF ANKLE FRACTURE Left 02/07/2018   Procedure: OPEN REDUCTION INTERNAL FIXATION (ORIF) LEFT BIMALLEOLAR ANKLE FRACTURE;  Surgeon: Tarry Kos, MD;  Location: [facility] OR;  Service: Orthopedics;  Laterality: Left;  . SPINAL CORD STIMULATOR IMPLANT    . TUBAL LIGATION  1986    There were no vitals filed for this visit.  Subjective Assessment - 03/31/18 0936    Subjective  Pt. reports shoulder and back are not bothering her today.  Primary complaint today is L ankle  stiffness.    Pertinent History  L ankle trimalleolar ankle fracture s/p ORIF on 02/07/18; L4-5 MIS TLIF with iliac crest bone graft 07/05/2013; Revision of posterior interbody fusion 07/06/14; Bil laminotomies and foraminotomies L3-L4 with decompression of L3 and L4 nerve roots 01/07/16; Thoracic laminectomies with placement of implanted spinal stimulator 05/25/17    How long can you walk comfortably?  not normally limited; currently limited due to L LE NWB requiring use of knee scooter    Patient Stated Goals  "reduce pain in R arm and buttocks" & "walk normally"    Currently in Pain?  Yes    Pain Score  5     Pain Location  Ankle    Pain Orientation  Left;Lower    Pain Descriptors / Indicators  --   "Stiffness"   Pain Type  Acute pain    Pain Frequency  Intermittent    Aggravating Factors   walking after prolonged sitting     Pain Relieving Factors  ice    Multiple Pain Sites  No                       [facility] Adult PT Treatment/Exercise - 03/31/18 0953      Self-Care   Self-Care  Other Self-Care Comments    Other Self-Care Comments   Discussed need for proper lumbar support in sitting and need for proper desk posture and computer positioning to reduce lumbar strain with pt. verbalizing understanding       Vasopneumatic   Number Minutes Vasopneumatic   15 minutes    Vasopnuematic Location   Ankle    Vasopneumatic Pressure  Medium    Vasopneumatic Temperature   coldest temp      Manual Therapy   Manual Therapy  Soft tissue mobilization    Soft tissue mobilization  to L peroneals, ant tib     Myofascial Release  scar massage    Passive ROM  Gentle stretch into DF with great toe extension x 1 min       Ankle Exercises: Stretches   Soleus Stretch  2 reps;60 seconds   Required clarification of positioning and 30 sec + rationale   Soleus Stretch Limitations  sitting with strap - slight knee flexion    Gastroc Stretch  2 reps;60 seconds   Required clarification of  positioning and 30 sec + rationale   Gastroc Stretch Limitations  sitting with strap -     Other Stretch  Standing gentle L bent and straight knee calf stretch on wall x 20 sec each with heavy cueing to avoid excessive pressure into stretch       Ankle Exercises: Seated   Towel Crunch  3 reps    Heel Raises  Both;20 reps   B UE pressure on knees    Other Seated Ankle Exercises  Seated EV with yellow looped TB at forefoot and basketball between knees x 10 rpes                PT Short Term Goals - 03/31/18 1130      PT SHORT TERM GOAL #1   Title  Independent with initial HEP    Status  Achieved      PT SHORT TERM GOAL #2   Title  Pt will verbalize/demonstrate understanding of appropriate posture and body mechanics needed for daily activities including work station set-up    Baseline  Patient to demonstrate     Status  Achieved        PT Long Term Goals - 03/31/18 1142      PT LONG TERM GOAL #1   Title  Independent with ongoing/advanced HEP    Status  On-going      PT LONG TERM GOAL #2   Title  Pt will report ability to sit for >/= 30 minutes w/o limitation due to buttock pain to allow for increased focus at work and improved tolerance for driving    Status  On-going      PT LONG TERM GOAL #3   Title  Pt will report ability to complete work tasks on computer inlcuding use of mouse with pain </= 3/10    Status  On-going      PT LONG TERM GOAL #4   Title  L ankle ROM WFL w/o increased pain to allow for normal gait pattern    Status  On-going      PT LONG TERM GOAL #5   Title  L ankle strength >/= 4/5 for improved gait stability    Status  On-going      PT LONG TERM GOAL #6   Title  Pt will ambulate with normal gait pattern w/o AD w/o evidence of L ankle instability    Status  On-going  PT LONG TERM GOAL #7   Title  Pt will return to full-time status at work w/o limitation due L ankle LOM, weakness or pain    Status  On-going            Plan -  03/31/18 0939    Clinical Impression Statement  Angela Cook reporting shoulder and back/buttocks are feeling fine today and primary complaint was L ankle stiffness.  Session focusing on gentle L ankle ROM and strengthening with good overall tolerance or activities today.  Pt. able to meet all STG's at this point verbalizing recent adjustment to desk posture and positioning with holding mouse as to reduce lumbar strain.  Ended visit with ice/compression to L ankle to reduce post-exercise swelling.  Will continue to progress toward goals.      PT Treatment/Interventions  Patient/family education;Manual techniques;Passive range of motion;Dry needling;Taping;Scar mobilization;Neuromuscular re-education;Therapeutic exercise;Therapeutic activities;Functional mobility training;Gait training;Stair training;Balance training;ADLs/Self Care Home Management;Ultrasound;Moist Heat;Electrical Stimulation;Cryotherapy;Vasopneumatic Device;Iontophoresis 4mg /ml Dexamethasone    Consulted and Agree with Plan of Care  Patient       Patient will benefit from skilled therapeutic intervention in order to improve the following deficits and impairments:  Pain, Increased muscle spasms, Impaired flexibility, Hypomobility, Decreased range of motion, Decreased strength, Difficulty walking, Abnormal gait, Decreased activity tolerance, Decreased endurance, Decreased scar mobility, Postural dysfunction, Improper body mechanics  Visit Diagnosis: Stiffness of left ankle, not elsewhere classified  Pain in left ankle and joints of left foot  Difficulty in walking, not elsewhere classified  Other abnormalities of gait and mobility  Facial weakness  Chronic bilateral low back pain with bilateral sciatica  Pain of right upper arm  Cramp and spasm  Abnormal posture     Problem List Patient Active Problem List   Diagnosis Date Noted  . Closed displaced trimalleolar fracture of lower leg with routine healing 02/17/2018  . Lumbar  stenosis with neurogenic claudication 01/07/2016  . Lumbar pseudoarthrosis 07/06/2014    Kermit BaloMicah Shadara Lopez, PTA 03/31/18 11:46 AM   [facility] [place] [address]  [address] [place], [place], [address] Phone: [phone]   Fax:  [phone]  Name: Angela Cook MRN: [id number] Date of Birth: 19-Apr-1956

## 2018-04-04 ENCOUNTER — Encounter (INDEPENDENT_AMBULATORY_CARE_PROVIDER_SITE_OTHER): Payer: Self-pay | Admitting: Orthopaedic Surgery

## 2018-04-04 ENCOUNTER — Encounter: Payer: Self-pay | Admitting: Physical Therapy

## 2018-04-04 ENCOUNTER — Ambulatory Visit: Payer: 59 | Admitting: Physical Therapy

## 2018-04-04 DIAGNOSIS — M5442 Lumbago with sciatica, left side: Secondary | ICD-10-CM

## 2018-04-04 DIAGNOSIS — R2689 Other abnormalities of gait and mobility: Secondary | ICD-10-CM

## 2018-04-04 DIAGNOSIS — M25572 Pain in left ankle and joints of left foot: Secondary | ICD-10-CM

## 2018-04-04 DIAGNOSIS — M25672 Stiffness of left ankle, not elsewhere classified: Secondary | ICD-10-CM

## 2018-04-04 DIAGNOSIS — R252 Cramp and spasm: Secondary | ICD-10-CM

## 2018-04-04 DIAGNOSIS — G8929 Other chronic pain: Secondary | ICD-10-CM

## 2018-04-04 DIAGNOSIS — M79621 Pain in right upper arm: Secondary | ICD-10-CM

## 2018-04-04 DIAGNOSIS — M6281 Muscle weakness (generalized): Secondary | ICD-10-CM

## 2018-04-04 DIAGNOSIS — R262 Difficulty in walking, not elsewhere classified: Secondary | ICD-10-CM

## 2018-04-04 DIAGNOSIS — M5441 Lumbago with sciatica, right side: Secondary | ICD-10-CM

## 2018-04-04 DIAGNOSIS — R293 Abnormal posture: Secondary | ICD-10-CM

## 2018-04-04 NOTE — Therapy (Signed)
[facility] [address]  [address] [place], [place], [address] Phone: [phone]   Fax:  [phone]  Physical Therapy Treatment  Patient Details  Name: Angela Cook MRN: [id number] Date of Birth: July 17, 1956 Referring Provider: N. Glee Arvin, MD   Encounter Date: 04/04/2018  PT End of Session - 04/04/18 0928    Visit Number  8    Number of Visits  25    Date for PT Re-Evaluation  05/13/18    Authorization Type  Aetna (no ionto)    PT Start Time  [phone]    PT Stop Time  1024    PT Time Calculation (min)  56 min    Activity Tolerance  Patient tolerated treatment well    Behavior During Therapy  [facility] for tasks assessed/performed       Past Medical History:  Diagnosis Date  . Arthritis   . Asthma    as a child, no issues since age 30  . Constipation due to opioid therapy   . Fibromyalgia    legs from back   . Pneumonia    as a child    Past Surgical History:  Procedure Laterality Date  . BACK SURGERY  07/05/13, 2015   lumbar 4-5, 2015- new bone graft inserted  . BUNIONECTOMY Left 2014  . CARPAL TUNNEL RELEASE Right 1986   left 1991  . CARPAL TUNNEL RELEASE Left   . CESAREAN SECTION  [phone]  . COLONOSCOPY    . LUMBAR LAMINECTOMY/DECOMPRESSION MICRODISCECTOMY Bilateral 01/07/2016   Procedure: DECOMPRESSIONL LUMBAR THREE-FOUR  WITH FORAMINOTOMIES ;  Surgeon: Barnett Abu, MD;  Location: [facility];  Service: Neurosurgery;  Laterality: Bilateral;  . ORIF ANKLE FRACTURE Left 02/07/2018   Procedure: OPEN REDUCTION INTERNAL FIXATION (ORIF) LEFT BIMALLEOLAR ANKLE FRACTURE;  Surgeon: Tarry Kos, MD;  Location: [facility] OR;  Service: Orthopedics;  Laterality: Left;  . SPINAL CORD STIMULATOR IMPLANT    . TUBAL LIGATION  1986    There were no vitals filed for this visit.  Subjective Assessment - 04/04/18 0932    Subjective  Pt only noting tigntness in ankle today, but denies any pain. Would like to use Mon & Thurs  sessions to focus on ankle, and Tues sessionso to address shoulder and back issues.    Pertinent History  L ankle trimalleolar ankle fracture s/p ORIF on 02/07/18; L4-5 MIS TLIF with iliac crest bone graft 07/05/2013; Revision of posterior interbody fusion 07/06/14; Bil laminotomies and foraminotomies L3-L4 with decompression of L3 and L4 nerve roots 01/07/16; Thoracic laminectomies with placement of implanted spinal stimulator 05/25/17    Patient Stated Goals  "reduce pain in R arm and buttocks" & "walk normally"    Currently in Pain?  No/denies                       [facility] Adult PT Treatment/Exercise - 04/04/18 0928      Exercises   Exercises  Ankle      Modalities   Modalities  Vasopneumatic      Vasopneumatic   Number Minutes Vasopneumatic   10 minutes    Vasopnuematic Location   Ankle    Vasopneumatic Pressure  High    Vasopneumatic Temperature   coldest temp      Ankle Exercises: Aerobic   Nustep  L3 x 6 min      Ankle Exercises: Standing   Other Standing Ankle Exercises  Airex pad - lateral wt  shift x20, heel/toe wt shift x20, marching in place x 20      Ankle Exercises: Seated   Marble Pickup  L foot x 20 marbles    BAPS  Sitting;Level 2;10 reps    BAPS Limitations  DF/PF, IV/EV, CW/CCW    Other Seated Ankle Exercises  L 4 way ankle with yellow TB x10    Other Seated Ankle Exercises  L foot long sitting toe curls into yellow TB x10             PT Education - 04/04/18 1014    Education Details  Updated ankle HEP - yellow TB for 4 way ankle    Person(s) Educated  Patient    Methods  Explanation;Demonstration;Handout    Comprehension  Verbalized understanding;Returned demonstration       PT Short Term Goals - 03/31/18 1130      PT SHORT TERM GOAL #1   Title  Independent with initial HEP    Status  Achieved      PT SHORT TERM GOAL #2   Title  Pt will verbalize/demonstrate understanding of appropriate posture and body mechanics needed for daily  activities including work station set-up    Baseline  Patient to demonstrate     Status  Achieved        PT Long Term Goals - 03/31/18 1142      PT LONG TERM GOAL #1   Title  Independent with ongoing/advanced HEP    Status  On-going      PT LONG TERM GOAL #2   Title  Pt will report ability to sit for >/= 30 minutes w/o limitation due to buttock pain to allow for increased focus at work and improved tolerance for driving    Status  On-going      PT LONG TERM GOAL #3   Title  Pt will report ability to complete work tasks on computer inlcuding use of mouse with pain </= 3/10    Status  On-going      PT LONG TERM GOAL #4   Title  L ankle ROM WFL w/o increased pain to allow for normal gait pattern    Status  On-going      PT LONG TERM GOAL #5   Title  L ankle strength >/= 4/5 for improved gait stability    Status  On-going      PT LONG TERM GOAL #6   Title  Pt will ambulate with normal gait pattern w/o AD w/o evidence of L ankle instability    Status  On-going      PT LONG TERM GOAL #7   Title  Pt will return to full-time status at work w/o limitation due L ankle LOM, weakness or pain    Status  On-going            Plan - 04/04/18 0935    Clinical Impression Statement  Berklee denies pain today, noting only stiffness/tightness in L ankle - especially feels restricted from medial incisional scar (plans to reach out to MD about this). Progressed ankle stretching to standing as well as ankle strengthening and proprioceptive training with good tolerance other than restrictions noted from incisiconal scar, therefore updated HEP. Will plan for focus of tomorrow's visit to address shoulder and back issues.    Rehab Potential  Good    PT Treatment/Interventions  Patient/family education;Manual techniques;Passive range of motion;Dry needling;Taping;Scar mobilization;Neuromuscular re-education;Therapeutic exercise;Therapeutic activities;Functional mobility training;Gait training;Stair  training;Balance training;ADLs/Self Care Home Management;Ultrasound;Moist Heat;Electrical Stimulation;Cryotherapy;Vasopneumatic Device;Iontophoresis  4mg /ml Dexamethasone    Consulted and Agree with Plan of Care  Patient       Patient will benefit from skilled therapeutic intervention in order to improve the following deficits and impairments:  Pain, Increased muscle spasms, Impaired flexibility, Hypomobility, Decreased range of motion, Decreased strength, Difficulty walking, Abnormal gait, Decreased activity tolerance, Decreased endurance, Decreased scar mobility, Postural dysfunction, Improper body mechanics  Visit Diagnosis: Stiffness of left ankle, not elsewhere classified  Pain in left ankle and joints of left foot  Difficulty in walking, not elsewhere classified  Other abnormalities of gait and mobility  Muscle weakness (generalized)  Chronic bilateral low back pain with bilateral sciatica  Pain of right upper arm  Cramp and spasm  Abnormal posture     Problem List Patient Active Problem List   Diagnosis Date Noted  . Closed displaced trimalleolar fracture of lower leg with routine healing 02/17/2018  . Lumbar stenosis with neurogenic claudication 01/07/2016  . Lumbar pseudoarthrosis 07/06/2014    Marry GuanJoAnne M Maddoxx Burkitt, PT, MPT 04/04/2018, 10:20 AM  [facility] [place] [address]  [address] [place], [place], [address] Phone: [phone]   Fax:  [phone]  Name: Angela Cook MRN: [id number] Date of Birth: 06-Feb-1956

## 2018-04-05 ENCOUNTER — Ambulatory Visit: Payer: 59

## 2018-04-05 DIAGNOSIS — M6281 Muscle weakness (generalized): Secondary | ICD-10-CM

## 2018-04-05 DIAGNOSIS — M79621 Pain in right upper arm: Secondary | ICD-10-CM

## 2018-04-05 DIAGNOSIS — M25572 Pain in left ankle and joints of left foot: Secondary | ICD-10-CM

## 2018-04-05 DIAGNOSIS — M25672 Stiffness of left ankle, not elsewhere classified: Secondary | ICD-10-CM

## 2018-04-05 DIAGNOSIS — R2689 Other abnormalities of gait and mobility: Secondary | ICD-10-CM

## 2018-04-05 DIAGNOSIS — R262 Difficulty in walking, not elsewhere classified: Secondary | ICD-10-CM

## 2018-04-05 DIAGNOSIS — M5442 Lumbago with sciatica, left side: Secondary | ICD-10-CM | POA: Diagnosis not present

## 2018-04-05 DIAGNOSIS — R293 Abnormal posture: Secondary | ICD-10-CM

## 2018-04-05 DIAGNOSIS — R252 Cramp and spasm: Secondary | ICD-10-CM

## 2018-04-05 DIAGNOSIS — G8929 Other chronic pain: Secondary | ICD-10-CM

## 2018-04-05 DIAGNOSIS — M5441 Lumbago with sciatica, right side: Secondary | ICD-10-CM

## 2018-04-05 NOTE — Therapy (Signed)
[facility] [place] [address]  [address] [place], [place], [address] Phone: [phone]   Fax:  [phone]  Physical Therapy Treatment  Patient Details  Name: Angela Cook MRN: [id number] Date of Birth: 28-Oct-1955 Referring Provider: N. Glee ArvinMichael Xu, MD   Encounter Date: 04/05/2018  PT End of Session - 04/05/18 1410    Visit Number  9    Number of Visits  25    Date for PT Re-Evaluation  05/13/18    Authorization Type  [employer]etna (no ionto)    PT Start Time  1401    PT Stop Time  1443    PT Time Calculation (min)  42 min    Activity Tolerance  Patient tolerated treatment well    Behavior During Therapy  [facility] for tasks assessed/performed       Past Medical History:  Diagnosis Date  . Arthritis   . Asthma    as a child, no issues since age 89  . Constipation due to opioid therapy   . Fibromyalgia    legs from back   . Pneumonia    as a child    Past Surgical History:  Procedure Laterality Date  . BACK SURGERY  07/05/13, 2015   lumbar 4-5, 2015- new bone graft inserted  . BUNIONECTOMY Left 2014  . CARPAL TUNNEL RELEASE Right 1986   left 1991  . CARPAL TUNNEL RELEASE Left   . CESAREAN SECTION  [phone] [id number]  . COLONOSCOPY    . LUMBAR LAMINECTOMY/DECOMPRESSION MICRODISCECTOMY Bilateral 01/07/2016   Procedure: DECOMPRESSIONL LUMBAR THREE-FOUR  WITH FORAMINOTOMIES ;  Surgeon: Barnett AbuHenry Elsner, MD;  Location: [facility];  Service: Neurosurgery;  Laterality: Bilateral;  . ORIF ANKLE FRACTURE Left 02/07/2018   Procedure: OPEN REDUCTION INTERNAL FIXATION (ORIF) LEFT BIMALLEOLAR ANKLE FRACTURE;  Surgeon: Tarry KosXu, Naiping M, MD;  Location: [facility];  Service: Orthopedics;  Laterality: Left;  . SPINAL CORD STIMULATOR IMPLANT    . TUBAL LIGATION  1986    There were no vitals filed for this visit.  Subjective Assessment - 04/05/18 1402    Subjective  Pt. noting she wishes to focus on back and shoulder in today's session.      Pertinent  History  L ankle trimalleolar ankle fracture s/p ORIF on 02/07/18; L4-5 MIS TLIF with iliac crest bone graft 07/05/2013; Revision of posterior interbody fusion 07/06/14; Bil laminotomies and foraminotomies L3-L4 with decompression of L3 and L4 nerve roots 01/07/16; Thoracic laminectomies with placement of implanted spinal stimulator 05/25/17    How long can you walk comfortably?  not normally limited; currently limited due to L LE NWB requiring use of knee scooter    Patient Stated Goals  "reduce pain in R arm and buttocks" & "walk normally"    Currently in Pain?  No/denies    Pain Score  0-No pain    Pain Score  3    Pain Location  Buttocks    Pain Orientation  Right;Left    Pain Descriptors / Indicators  Pressure    Pain Type  Chronic pain    Pain Onset  More than a month ago    Pain Frequency  Constant    Aggravating Factors   Prolonged sitting     Pain Score  0   up to 7/10 in mornings and unsure of trigger    Pain Location  Arm    Pain Orientation  Right    Pain Descriptors / Indicators  Aching  Pain Type  Chronic pain    Pain Onset  More than a month ago    Pain Frequency  Intermittent                       [facility] Adult PT Treatment/Exercise - 04/05/18 1430      Lumbar Exercises: Stretches   Piriformis Stretch  Right;30 seconds;1 rep      Lumbar Exercises: Supine   Bridge  10 reps;3 seconds    Bridge Limitations  partial ROM       Shoulder Exercises: Supine   Horizontal ABduction  Both;10 reps;20 reps;Strengthening    External Rotation  12 reps;Theraband;Strengthening    Theraband Level (Shoulder External Rotation)  Level 2 (Red)    External Rotation Limitations  Hooklying on pool noodle     Flexion  10 reps;Theraband;Right;Left    Theraband Level (Shoulder Flexion)  Level 2 (Red)      Manual Therapy   Manual Therapy  Soft tissue mobilization    Manual therapy comments  supine     Soft tissue mobilization  STM to R shoulder complex; R glute STM - only  mild tenderness     Myofascial Release  TPR to R lateral deltoid      Ankle Exercises: Aerobic   Nustep  L3 x 6 min (UE/LE)               PT Short Term Goals - 03/31/18 1130      PT SHORT TERM GOAL #1   Title  Independent with initial HEP    Status  Achieved      PT SHORT TERM GOAL #2   Title  Pt will verbalize/demonstrate understanding of appropriate posture and body mechanics needed for daily activities including work station set-up    Baseline  Patient to demonstrate     Status  Achieved        PT Long Term Goals - 03/31/18 1142      PT LONG TERM GOAL #1   Title  Independent with ongoing/advanced HEP    Status  On-going      PT LONG TERM GOAL #2   Title  Pt will report ability to sit for >/= 30 minutes w/o limitation due to buttock pain to allow for increased focus at work and improved tolerance for driving    Status  On-going      PT LONG TERM GOAL #3   Title  Pt will report ability to complete work tasks on computer inlcuding use of mouse with pain </= 3/10    Status  On-going      PT LONG TERM GOAL #4   Title  L ankle ROM WFL w/o increased pain to allow for normal gait pattern    Status  On-going      PT LONG TERM GOAL #5   Title  L ankle strength >/= 4/5 for improved gait stability    Status  On-going      PT LONG TERM GOAL #6   Title  Pt will ambulate with normal gait pattern w/o AD w/o evidence of L ankle instability    Status  On-going      PT LONG TERM GOAL #7   Title  Pt will return to full-time status at work w/o limitation due L ankle LOM, weakness or pain    Status  On-going            Plan - 04/05/18 1426    Clinical  Impression Statement  Session focusing on back and shoulder per pt. request.  Notes she was nearly pain free initially to start session and only with mild R shoulder, "catching" pain with abduction motions with therex today.  Tolerated all scapular strengthening activities in session well today.  Did have noted R lateral  deltoid ttp and palpable TP thus addressed this with manual therapy with good response.  Ended visit pain free thus modalities deferred.  Pt. noting L ankle feeling well today and with visible improvement in gait pattern today.      PT Treatment/Interventions  Patient/family education;Manual techniques;Passive range of motion;Dry needling;Taping;Scar mobilization;Neuromuscular re-education;Therapeutic exercise;Therapeutic activities;Functional mobility training;Gait training;Stair training;Balance training;ADLs/Self Care Home Management;Ultrasound;Moist Heat;Electrical Stimulation;Cryotherapy;Vasopneumatic Device;Iontophoresis 4mg /ml Dexamethasone    PT Next Visit Plan  continue ankle 2x/wk, and shoulder back 1x/wk.    Consulted and Agree with Plan of Care  Patient       Patient will benefit from skilled therapeutic intervention in order to improve the following deficits and impairments:  Pain, Increased muscle spasms, Impaired flexibility, Hypomobility, Decreased range of motion, Decreased strength, Difficulty walking, Abnormal gait, Decreased activity tolerance, Decreased endurance, Decreased scar mobility, Postural dysfunction, Improper body mechanics  Visit Diagnosis: Stiffness of left ankle, not elsewhere classified  Pain in left ankle and joints of left foot  Difficulty in walking, not elsewhere classified  Other abnormalities of gait and mobility  Muscle weakness (generalized)  Chronic bilateral low back pain with bilateral sciatica  Pain of right upper arm  Cramp and spasm  Abnormal posture     Problem List Patient Active Problem List   Diagnosis Date Noted  . Closed displaced trimalleolar fracture of lower leg with routine healing 02/17/2018  . Lumbar stenosis with neurogenic claudication 01/07/2016  . Lumbar pseudoarthrosis 07/06/2014    Kermit Balo, PTA 04/05/18 6:13 PM   [facility] [address]  [address] [place], [place], [address] Phone: [phone]   Fax:  [phone]  Name: Angela Cook MRN: [id number] Date of Birth: Oct 27, 1955

## 2018-04-06 ENCOUNTER — Telehealth (INDEPENDENT_AMBULATORY_CARE_PROVIDER_SITE_OTHER): Payer: Self-pay | Admitting: Orthopaedic Surgery

## 2018-04-06 NOTE — Telephone Encounter (Signed)
[employer] disability form from 9/5 re faxed [phone]

## 2018-04-07 ENCOUNTER — Ambulatory Visit: Payer: 59 | Admitting: Physical Therapy

## 2018-04-07 DIAGNOSIS — M5441 Lumbago with sciatica, right side: Secondary | ICD-10-CM

## 2018-04-07 DIAGNOSIS — R262 Difficulty in walking, not elsewhere classified: Secondary | ICD-10-CM

## 2018-04-07 DIAGNOSIS — R293 Abnormal posture: Secondary | ICD-10-CM

## 2018-04-07 DIAGNOSIS — R2689 Other abnormalities of gait and mobility: Secondary | ICD-10-CM

## 2018-04-07 DIAGNOSIS — M5442 Lumbago with sciatica, left side: Secondary | ICD-10-CM | POA: Diagnosis not present

## 2018-04-07 DIAGNOSIS — R252 Cramp and spasm: Secondary | ICD-10-CM

## 2018-04-07 DIAGNOSIS — G8929 Other chronic pain: Secondary | ICD-10-CM

## 2018-04-07 DIAGNOSIS — M6281 Muscle weakness (generalized): Secondary | ICD-10-CM

## 2018-04-07 DIAGNOSIS — M79621 Pain in right upper arm: Secondary | ICD-10-CM

## 2018-04-07 DIAGNOSIS — M25672 Stiffness of left ankle, not elsewhere classified: Secondary | ICD-10-CM

## 2018-04-07 DIAGNOSIS — M25572 Pain in left ankle and joints of left foot: Secondary | ICD-10-CM

## 2018-04-07 NOTE — Therapy (Addendum)
[facility] [address]  [address] [place], [place], [address] Phone: [phone]   Fax:  [phone]  Physical Therapy Treatment  Patient Details  Name: Angela Cook MRN: [id number] Date of Birth: 1955-12-29 Referring Provider: N. Glee Arvin, MD   Encounter Date: 04/07/2018  PT End of Session - 04/07/18 1101    Visit Number  10    Number of Visits  25    Date for PT Re-Evaluation  05/13/18    Authorization Type  Aetna (no ionto)    PT Start Time  1101    PT Stop Time  1149    PT Time Calculation (min)  48 min    Activity Tolerance  Patient tolerated treatment well    Behavior During Therapy  WFL for tasks assessed/performed       Past Medical History:  Diagnosis Date  . Arthritis   . Asthma    as a child, no issues since age 64  . Constipation due to opioid therapy   . Fibromyalgia    legs from back   . Pneumonia    as a child    Past Surgical History:  Procedure Laterality Date  . BACK SURGERY  07/05/13, 2015   lumbar 4-5, 2015- new bone graft inserted  . BUNIONECTOMY Left 2014  . CARPAL TUNNEL RELEASE Right 1986   left 1991  . CARPAL TUNNEL RELEASE Left   . CESAREAN SECTION  [phone]  . COLONOSCOPY    . LUMBAR LAMINECTOMY/DECOMPRESSION MICRODISCECTOMY Bilateral 01/07/2016   Procedure: DECOMPRESSIONL LUMBAR THREE-FOUR  WITH FORAMINOTOMIES ;  Surgeon: Barnett Abu, MD;  Location: [facility];  Service: Neurosurgery;  Laterality: Bilateral;  . ORIF ANKLE FRACTURE Left 02/07/2018   Procedure: OPEN REDUCTION INTERNAL FIXATION (ORIF) LEFT BIMALLEOLAR ANKLE FRACTURE;  Surgeon: Tarry Kos, MD;  Location: [facility];  Service: Orthopedics;  Laterality: Left;  . SPINAL CORD STIMULATOR IMPLANT    . TUBAL LIGATION  1986    There were no vitals filed for this visit.  Subjective Assessment - 04/07/18 1103    Subjective  Pt reports she reached out to Dr. Roda Shutters about the incisional scar restrictions and he told her  to just keep working on "scar remediation".    Pertinent History  L ankle trimalleolar ankle fracture s/p ORIF on 02/07/18; L4-5 MIS TLIF with iliac crest bone graft 07/05/2013; Revision of posterior interbody fusion 07/06/14; Bil laminotomies and foraminotomies L3-L4 with decompression of L3 and L4 nerve roots 01/07/16; Thoracic laminectomies with placement of implanted spinal stimulator 05/25/17    Patient Stated Goals  "reduce pain in R arm and buttocks" & "walk normally"    Currently in Pain?  Yes    Pain Score  3     Pain Location  Ankle    Pain Orientation  Left    Pain Descriptors / Indicators  --   "pulling & stiffness"   Pain Type  Acute pain    Pain Frequency  Intermittent    Multiple Pain Sites  No    Pain Onset  More than a month ago    Pain Onset  More than a month ago                       [facility] Adult PT Treatment/Exercise - 04/07/18 1101      Exercises   Exercises  Ankle      Manual Therapy   Manual Therapy  Soft tissue  mobilization    Soft tissue mobilization  L ankle medial incision scar tissue mobilization      Ankle Exercises: Aerobic   Nustep  L3 x 6 min (LE only)      Ankle Exercises: Standing   Vector Stance  Left   10 reps    Vector Stance Limitations  4 way clocks to colored discs - intermittent UE support on counter    SLS  L SLS on Airex pad 3 x 10 sec - PT HHA    Other Standing Ankle Exercises  Airex pad - lateral wt shift x20, heel/toe wt shift x20, marching in place x 20      Ankle Exercises: Seated   BAPS  Sitting;Level 2;10 reps;Weight    BAPS Weights (lbs)  2.5# - 1 set DF/PF at each pole    BAPS Limitations  DF/PF; IV/EV & CW/CCW w/o added weight    Other Seated Ankle Exercises  L foot long sitting toe curls into yellow TB x10      Ankle Exercises: Supine   T-Band  Longsitting L 4 way ankle with yellow TB x10, except PF with red TB      Ankle Exercises: Sidelying   Ankle Inversion  Left;15 reps    Ankle Eversion  Left;15 reps                PT Short Term Goals - 03/31/18 1130      PT SHORT TERM GOAL #1   Title  Independent with initial HEP    Status  Achieved      PT SHORT TERM GOAL #2   Title  Pt will verbalize/demonstrate understanding of appropriate posture and body mechanics needed for daily activities including work station set-up    Baseline  Patient to demonstrate     Status  Achieved        PT Long Term Goals - 03/31/18 1142      PT LONG TERM GOAL #1   Title  Independent with ongoing/advanced HEP    Status  On-going      PT LONG TERM GOAL #2   Title  Pt will report ability to sit for >/= 30 minutes w/o limitation due to buttock pain to allow for increased focus at work and improved tolerance for driving    Status  On-going      PT LONG TERM GOAL #3   Title  Pt will report ability to complete work tasks on computer inlcuding use of mouse with pain </= 3/10    Status  On-going      PT LONG TERM GOAL #4   Title  L ankle ROM WFL w/o increased pain to allow for normal gait pattern    Status  On-going      PT LONG TERM GOAL #5   Title  L ankle strength >/= 4/5 for improved gait stability    Status  On-going      PT LONG TERM GOAL #6   Title  Pt will ambulate with normal gait pattern w/o AD w/o evidence of L ankle instability    Status  On-going      PT LONG TERM GOAL #7   Title  Pt will return to full-time status at work w/o limitation due L ankle LOM, weakness or pain    Status  On-going            Plan - 04/07/18 1149    Clinical Impression Statement  Larita FifeLynn feels like ankle progression limited  by incisional scar adhesion which does not seem to be responding to scar mobilization - pt states it only makes it hurt more. Provided education in alternatives to seated 4 way ankle with theraband due c/o increased buttock pain with sitting on edge of mat/chair - pt noitng better tolerance in longsitting and with gravity resistsed ankle inversion/eversion in sidelying. Poor  stability with SLS stance activities requiring at least intermittent UE support. Pt will continue to benefit from skilled PT to address ongoing L ankle deficits as well as low back/buttock and shoulder issues as indicated.    Rehab Potential  Good    PT Treatment/Interventions  Patient/family education;Manual techniques;Passive range of motion;Dry needling;Taping;Scar mobilization;Neuromuscular re-education;Therapeutic exercise;Therapeutic activities;Functional mobility training;Gait training;Stair training;Balance training;ADLs/Self Care Home Management;Ultrasound;Moist Heat;Electrical Stimulation;Cryotherapy;Vasopneumatic Device;Iontophoresis 4mg /ml Dexamethasone    PT Next Visit Plan  continue ankle 2x/wk, and shoulder back 1x/wk.    Consulted and Agree with Plan of Care  Patient       Patient will benefit from skilled therapeutic intervention in order to improve the following deficits and impairments:  Pain, Increased muscle spasms, Impaired flexibility, Hypomobility, Decreased range of motion, Decreased strength, Difficulty walking, Abnormal gait, Decreased activity tolerance, Decreased endurance, Decreased scar mobility, Postural dysfunction, Improper body mechanics  Visit Diagnosis: Stiffness of left ankle, not elsewhere classified  Pain in left ankle and joints of left foot  Difficulty in walking, not elsewhere classified  Other abnormalities of gait and mobility  Muscle weakness (generalized)  Chronic bilateral low back pain with bilateral sciatica  Pain of right upper arm  Cramp and spasm  Abnormal posture     Problem List Patient Active Problem List   Diagnosis Date Noted  . Closed displaced trimalleolar fracture of lower leg with routine healing 02/17/2018  . Lumbar stenosis with neurogenic claudication 01/07/2016  . Lumbar pseudoarthrosis 07/06/2014    Marry Guan, PT, MPT 04/07/2018, 12:06 PM  [facility] [address]  [address] [place], [place], [address] Phone: [phone]   Fax:  [phone]  Name: NADRA HRITZ MRN: [id number] Date of Birth: 07-Apr-1956

## 2018-04-11 ENCOUNTER — Encounter: Payer: Self-pay | Admitting: Physical Therapy

## 2018-04-11 ENCOUNTER — Ambulatory Visit: Payer: 59 | Admitting: Physical Therapy

## 2018-04-11 DIAGNOSIS — M25572 Pain in left ankle and joints of left foot: Secondary | ICD-10-CM

## 2018-04-11 DIAGNOSIS — M79621 Pain in right upper arm: Secondary | ICD-10-CM

## 2018-04-11 DIAGNOSIS — R2689 Other abnormalities of gait and mobility: Secondary | ICD-10-CM

## 2018-04-11 DIAGNOSIS — M5442 Lumbago with sciatica, left side: Secondary | ICD-10-CM

## 2018-04-11 DIAGNOSIS — M6281 Muscle weakness (generalized): Secondary | ICD-10-CM

## 2018-04-11 DIAGNOSIS — M5441 Lumbago with sciatica, right side: Secondary | ICD-10-CM

## 2018-04-11 DIAGNOSIS — R252 Cramp and spasm: Secondary | ICD-10-CM

## 2018-04-11 DIAGNOSIS — R262 Difficulty in walking, not elsewhere classified: Secondary | ICD-10-CM

## 2018-04-11 DIAGNOSIS — G8929 Other chronic pain: Secondary | ICD-10-CM

## 2018-04-11 DIAGNOSIS — M25672 Stiffness of left ankle, not elsewhere classified: Secondary | ICD-10-CM

## 2018-04-11 DIAGNOSIS — R293 Abnormal posture: Secondary | ICD-10-CM

## 2018-04-11 NOTE — Therapy (Addendum)
[facility] [address]  [address] [place], [place], [address] Phone: [phone]   Fax:  [phone]  Physical Therapy Treatment  Patient Details  Name: Angela Cook MRN: [id number] Date of Birth: 1956/05/15 Referring Provider: N. Glee Arvin, MD   Encounter Date: 04/11/2018  PT End of Session - 04/11/18 0932    Visit Number  11    Number of Visits  25    Date for PT Re-Evaluation  05/13/18    Authorization Type  Aetna (no ionto)    PT Start Time  0932    PT Stop Time  1015    PT Time Calculation (min)  43 min    Activity Tolerance  Patient tolerated treatment well    Behavior During Therapy  WFL for tasks assessed/performed       Past Medical History:  Diagnosis Date  . Arthritis   . Asthma    as a child, no issues since age 63  . Constipation due to opioid therapy   . Fibromyalgia    legs from back   . Pneumonia    as a child    Past Surgical History:  Procedure Laterality Date  . BACK SURGERY  07/05/13, 2015   lumbar 4-5, 2015- new bone graft inserted  . BUNIONECTOMY Left 2014  . CARPAL TUNNEL RELEASE Right 1986   left 1991  . CARPAL TUNNEL RELEASE Left   . CESAREAN SECTION  [phone]  . COLONOSCOPY    . LUMBAR LAMINECTOMY/DECOMPRESSION MICRODISCECTOMY Bilateral 01/07/2016   Procedure: DECOMPRESSIONL LUMBAR THREE-FOUR  WITH FORAMINOTOMIES ;  Surgeon: Barnett Abu, MD;  Location: [facility];  Service: Neurosurgery;  Laterality: Bilateral;  . ORIF ANKLE FRACTURE Left 02/07/2018   Procedure: OPEN REDUCTION INTERNAL FIXATION (ORIF) LEFT BIMALLEOLAR ANKLE FRACTURE;  Surgeon: Tarry Kos, MD;  Location: [facility];  Service: Orthopedics;  Laterality: Left;  . SPINAL CORD STIMULATOR IMPLANT    . TUBAL LIGATION  1986    There were no vitals filed for this visit.  Subjective Assessment - 04/11/18 0938    Subjective  Pt reporting she feels like the ankle stretches make her hurt more due to the restrictions  from scar tissue - did not do the stretches over the weekend and feels like she was able to walk better. Noting more issues with buttocks today, therefore requesting to focus on this instread of the ankle.    Pertinent History  L ankle trimalleolar ankle fracture s/p ORIF on 02/07/18; L4-5 MIS TLIF with iliac crest bone graft 07/05/2013; Revision of posterior interbody fusion 07/06/14; Bil laminotomies and foraminotomies L3-L4 with decompression of L3 and L4 nerve roots 01/07/16; Thoracic laminectomies with placement of implanted spinal stimulator 05/25/17    Patient Stated Goals  "reduce pain in R arm and buttocks" & "walk normally"    Pain Score  2     Pain Location  Ankle    Pain Orientation  Left    Pain Descriptors / Indicators  Tightness   "stiff"   Pain Type  Acute pain    Pain Frequency  Intermittent    Pain Score  7    Pain Location  Buttocks    Pain Orientation  Right    Pain Descriptors / Indicators  Throbbing    Pain Type  Chronic pain    Pain Frequency  Constant    Pain Onset  More than a month ago  [facility] Adult PT Treatment/Exercise - 04/11/18 0932      Exercises   Exercises  Lumbar      Manual Therapy   Manual Therapy  Soft tissue mobilization;Myofascial release;Taping    Manual therapy comments  prone    Soft tissue mobilization  R med/lat HS - proximal to mid belly    Myofascial Release  manual TPR to R HS - proximal med/lat & lat mid belly    Kinesiotex  Inhibit Muscle      Kinesiotix   Inhibit Muscle   30% strips R med/lat HS from just proximal to knee to proximal to insertion      Ankle Exercises: Aerobic   Nustep  L3 x 6 min (LE only)       Trigger Point Dry Needling - 04/11/18 0932    Consent Given?  Yes    Muscles Treated Lower Body  Hamstring    Hamstring Response  Twitch response elicited;Palpable increased muscle length   R med/lat HS proximal to mid belly          PT Education - 04/11/18 1010    Education  Details  Role of kinesiotaping + wearing instructions    Person(s) Educated  Patient    Methods  Explanation    Comprehension  Verbalized understanding       PT Short Term Goals - 03/31/18 1130      PT SHORT TERM GOAL #1   Title  Independent with initial HEP    Status  Achieved      PT SHORT TERM GOAL #2   Title  Pt will verbalize/demonstrate understanding of appropriate posture and body mechanics needed for daily activities including work station set-up    Baseline  Patient to demonstrate     Status  Achieved        PT Long Term Goals - 03/31/18 1142      PT LONG TERM GOAL #1   Title  Independent with ongoing/advanced HEP    Status  On-going      PT LONG TERM GOAL #2   Title  Pt will report ability to sit for >/= 30 minutes w/o limitation due to buttock pain to allow for increased focus at work and improved tolerance for driving    Status  On-going      PT LONG TERM GOAL #3   Title  Pt will report ability to complete work tasks on computer inlcuding use of mouse with pain </= 3/10    Status  On-going      PT LONG TERM GOAL #4   Title  L ankle ROM WFL w/o increased pain to allow for normal gait pattern    Status  On-going      PT LONG TERM GOAL #5   Title  L ankle strength >/= 4/5 for improved gait stability    Status  On-going      PT LONG TERM GOAL #6   Title  Pt will ambulate with normal gait pattern w/o AD w/o evidence of L ankle instability    Status  On-going      PT LONG TERM GOAL #7   Title  Pt will return to full-time status at work w/o limitation due L ankle LOM, weakness or pain    Status  On-going            Plan - 04/11/18 1015    Clinical Impression Statement  Angela Cook reporting more of an issue with buttock area today and requested today  session focus on this rather than ankle as she has to sit for several hours this pm. Increased muscle tension and mutiple TPs identified in proximal to mid belly R HS, lateral more spread out than medial -  addressed with manual therapy including DN with positive twitch response elicited and palpable reduction in muscle tension following treatment. Initiated trial of kinesiotaping to R medial & lateral HS to promote further muscle relaxation and will assess response n next visit.    Rehab Potential  Good    PT Treatment/Interventions  Patient/family education;Manual techniques;Passive range of motion;Dry needling;Taping;Scar mobilization;Neuromuscular re-education;Therapeutic exercise;Therapeutic activities;Functional mobility training;Gait training;Stair training;Balance training;ADLs/Self Care Home Management;Ultrasound;Moist Heat;Electrical Stimulation;Cryotherapy;Vasopneumatic Device;Iontophoresis 4mg /ml Dexamethasone    PT Next Visit Plan  continue ankle 2x/wk, and shoulder back 1x/wk.    Consulted and Agree with Plan of Care  Patient       Patient will benefit from skilled therapeutic intervention in order to improve the following deficits and impairments:  Pain, Increased muscle spasms, Impaired flexibility, Hypomobility, Decreased range of motion, Decreased strength, Difficulty walking, Abnormal gait, Decreased activity tolerance, Decreased endurance, Decreased scar mobility, Postural dysfunction, Improper body mechanics  Visit Diagnosis: Stiffness of left ankle, not elsewhere classified  Pain in left ankle and joints of left foot  Difficulty in walking, not elsewhere classified  Other abnormalities of gait and mobility  Muscle weakness (generalized)  Chronic bilateral low back pain with bilateral sciatica  Pain of right upper arm  Cramp and spasm  Abnormal posture     Problem List Patient Active Problem List   Diagnosis Date Noted  . Closed displaced trimalleolar fracture of lower leg with routine healing 02/17/2018  . Lumbar stenosis with neurogenic claudication 01/07/2016  . Lumbar pseudoarthrosis 07/06/2014    Angela Cook, PT, MPT 04/11/2018, 10:41 AM  [facility] [address]  [address] [place], [place], [address] Phone: [phone]   Fax:  [phone]  Name: Angela Cook MRN: [id number] Date of Birth: 1956/05/27

## 2018-04-12 ENCOUNTER — Encounter: Payer: Self-pay | Admitting: Physical Therapy

## 2018-04-12 ENCOUNTER — Ambulatory Visit: Payer: 59 | Admitting: Physical Therapy

## 2018-04-12 DIAGNOSIS — M5441 Lumbago with sciatica, right side: Secondary | ICD-10-CM

## 2018-04-12 DIAGNOSIS — G8929 Other chronic pain: Secondary | ICD-10-CM

## 2018-04-12 DIAGNOSIS — M79621 Pain in right upper arm: Secondary | ICD-10-CM

## 2018-04-12 DIAGNOSIS — R252 Cramp and spasm: Secondary | ICD-10-CM

## 2018-04-12 DIAGNOSIS — M5442 Lumbago with sciatica, left side: Secondary | ICD-10-CM

## 2018-04-12 DIAGNOSIS — R262 Difficulty in walking, not elsewhere classified: Secondary | ICD-10-CM

## 2018-04-12 DIAGNOSIS — R2689 Other abnormalities of gait and mobility: Secondary | ICD-10-CM

## 2018-04-12 DIAGNOSIS — M6281 Muscle weakness (generalized): Secondary | ICD-10-CM

## 2018-04-12 DIAGNOSIS — R293 Abnormal posture: Secondary | ICD-10-CM

## 2018-04-12 DIAGNOSIS — M25572 Pain in left ankle and joints of left foot: Secondary | ICD-10-CM

## 2018-04-12 DIAGNOSIS — M25672 Stiffness of left ankle, not elsewhere classified: Secondary | ICD-10-CM

## 2018-04-12 NOTE — Therapy (Signed)
[facility] [address]  [address] [place], [place], [address] Phone: [phone]   Fax:  [phone]  Physical Therapy Treatment  Patient Details  Name: Angela Cook MRN: [id number] Date of Birth: Feb 08, 1956 Referring Provider: N. Glee Arvin, MD   Encounter Date: 04/12/2018  PT End of Session - 04/12/18 1407    Visit Number  12    Number of Visits  25    Date for PT Re-Evaluation  05/13/18    Authorization Type  Aetna (no ionto)    PT Start Time  1407    PT Stop Time  1501    PT Time Calculation (min)  54 min    Activity Tolerance  Patient tolerated treatment well    Behavior During Therapy  WFL for tasks assessed/performed       Past Medical History:  Diagnosis Date  . Arthritis   . Asthma    as a child, no issues since age 62  . Constipation due to opioid therapy   . Fibromyalgia    legs from back   . Pneumonia    as a child    Past Surgical History:  Procedure Laterality Date  . BACK SURGERY  07/05/13, 2015   lumbar 4-5, 2015- new bone graft inserted  . BUNIONECTOMY Left 2014  . CARPAL TUNNEL RELEASE Right 1986   left 1991  . CARPAL TUNNEL RELEASE Left   . CESAREAN SECTION  [phone]  . COLONOSCOPY    . LUMBAR LAMINECTOMY/DECOMPRESSION MICRODISCECTOMY Bilateral 01/07/2016   Procedure: DECOMPRESSIONL LUMBAR THREE-FOUR  WITH FORAMINOTOMIES ;  Surgeon: Barnett Abu, MD;  Location: [facility];  Service: Neurosurgery;  Laterality: Bilateral;  . ORIF ANKLE FRACTURE Left 02/07/2018   Procedure: OPEN REDUCTION INTERNAL FIXATION (ORIF) LEFT BIMALLEOLAR ANKLE FRACTURE;  Surgeon: Tarry Kos, MD;  Location: [facility];  Service: Orthopedics;  Laterality: Left;  . SPINAL CORD STIMULATOR IMPLANT    . TUBAL LIGATION  1986    There were no vitals filed for this visit.  Subjective Assessment - 04/12/18 1408    Subjective  Pt reporting improvement in buttock pain after last session and feels ok with focusing on the  ankle today.    Pertinent History  L ankle trimalleolar ankle fracture s/p ORIF on 02/07/18; L4-5 MIS TLIF with iliac crest bone graft 07/05/2013; Revision of posterior interbody fusion 07/06/14; Bil laminotomies and foraminotomies L3-L4 with decompression of L3 and L4 nerve roots 01/07/16; Thoracic laminectomies with placement of implanted spinal stimulator 05/25/17    Patient Stated Goals  "reduce pain in R arm and buttocks" & "walk normally"    Currently in Pain?  Yes    Pain Score  3     Pain Location  Ankle    Pain Orientation  Left    Pain Descriptors / Indicators  Tightness   "stiffness"   Pain Type  Acute pain    Pain Frequency  Intermittent    Pain Score  5    Pain Location  Buttocks    Pain Orientation  Right    Pain Descriptors / Indicators  Throbbing    Pain Type  Chronic pain    Pain Frequency  Constant    Pain Onset  More than a month ago                       [facility] Adult PT Treatment/Exercise - 04/12/18 1407      Exercises  Exercises  Ankle      Modalities   Modalities  Vasopneumatic      Vasopneumatic   Number Minutes Vasopneumatic   15 minutes    Vasopnuematic Location   Ankle    Vasopneumatic Pressure  Medium;High    Vasopneumatic Temperature   coldest temp      Ankle Exercises: Aerobic   Recumbent Bike  L2 x 4 min, reduced to L1 x 2 min      Ankle Exercises: Standing   SLS  L SLS + B pallof press with yellow TB x10    Heel Raises  Both;10 reps;3 seconds   2 sets   Heel Raises Limitations  1st set - B con/ecc, 2nd set - B con/L ecc; UE support on counter    Toe Raise  10 reps;3 seconds    Toe Raise Limitations  UE support on counter    Other Standing Ankle Exercises  Mini-tramp - lateral wt shift x20, heel/toe wt shift x20, B staggered stance wt shift x10 each, marching in place x 20, L SLS 2 x 15 sec; intermittent UE support    Other Standing Ankle Exercises  R/L fwd lunge x10 each; single UE support on counter      Ankle Exercises:  Seated   Other Seated Ankle Exercises  L 4 way ankle with red TB x10               PT Short Term Goals - 03/31/18 1130      PT SHORT TERM GOAL #1   Title  Independent with initial HEP    Status  Achieved      PT SHORT TERM GOAL #2   Title  Pt will verbalize/demonstrate understanding of appropriate posture and body mechanics needed for daily activities including work station set-up    Baseline  Patient to demonstrate     Status  Achieved        PT Long Term Goals - 03/31/18 1142      PT LONG TERM GOAL #1   Title  Independent with ongoing/advanced HEP    Status  On-going      PT LONG TERM GOAL #2   Title  Pt will report ability to sit for >/= 30 minutes w/o limitation due to buttock pain to allow for increased focus at work and improved tolerance for driving    Status  On-going      PT LONG TERM GOAL #3   Title  Pt will report ability to complete work tasks on computer inlcuding use of mouse with pain </= 3/10    Status  On-going      PT LONG TERM GOAL #4   Title  L ankle ROM WFL w/o increased pain to allow for normal gait pattern    Status  On-going      PT LONG TERM GOAL #5   Title  L ankle strength >/= 4/5 for improved gait stability    Status  On-going      PT LONG TERM GOAL #6   Title  Pt will ambulate with normal gait pattern w/o AD w/o evidence of L ankle instability    Status  On-going      PT LONG TERM GOAL #7   Title  Pt will return to full-time status at work w/o limitation due L ankle LOM, weakness or pain    Status  On-going            Plan - 04/12/18 1415  Clinical Impression Statement  Niylah reporting improvement in buttock pain following yesterday's session and able to resume focus on L ankle rehab today. Some continued instability noted on uneven/compliant surfaces but improving balance/stability noted with SLS on firm surfaces. Pt able to progress strengthening to include more closed chain activities as well as advance 4 way ankle to  red TB today. Increased soreness noted at end of session, with pt still feeling like scar tissue restrictions are mostly to blame - session ended with vasopneumatic compression to reduce pain and edema.    Rehab Potential  Good    PT Treatment/Interventions  Patient/family education;Manual techniques;Passive range of motion;Dry needling;Taping;Scar mobilization;Neuromuscular re-education;Therapeutic exercise;Therapeutic activities;Functional mobility training;Gait training;Stair training;Balance training;ADLs/Self Care Home Management;Ultrasound;Moist Heat;Electrical Stimulation;Cryotherapy;Vasopneumatic Device;Iontophoresis 4mg /ml Dexamethasone    PT Next Visit Plan  continue ankle 2x/wk, and shoulder back 1x/wk.    Consulted and Agree with Plan of Care  Patient       Patient will benefit from skilled therapeutic intervention in order to improve the following deficits and impairments:  Pain, Increased muscle spasms, Impaired flexibility, Hypomobility, Decreased range of motion, Decreased strength, Difficulty walking, Abnormal gait, Decreased activity tolerance, Decreased endurance, Decreased scar mobility, Postural dysfunction, Improper body mechanics  Visit Diagnosis: Stiffness of left ankle, not elsewhere classified  Pain in left ankle and joints of left foot  Difficulty in walking, not elsewhere classified  Other abnormalities of gait and mobility  Muscle weakness (generalized)  Chronic bilateral low back pain with bilateral sciatica  Pain of right upper arm  Cramp and spasm  Abnormal posture     Problem List Patient Active Problem List   Diagnosis Date Noted  . Closed displaced trimalleolar fracture of lower leg with routine healing 02/17/2018  . Lumbar stenosis with neurogenic claudication 01/07/2016  . Lumbar pseudoarthrosis 07/06/2014    Marry Guan, PT, MPT 04/12/2018, 6:18 PM  [facility] [address]   [address] [place], [place], [address] Phone: [phone]   Fax:  [phone]  Name: SETSUKO ROBINS MRN: [id number] Date of Birth: January 14, 1956

## 2018-04-14 ENCOUNTER — Ambulatory Visit: Payer: 59

## 2018-04-14 DIAGNOSIS — R252 Cramp and spasm: Secondary | ICD-10-CM

## 2018-04-14 DIAGNOSIS — G8929 Other chronic pain: Secondary | ICD-10-CM

## 2018-04-14 DIAGNOSIS — M25572 Pain in left ankle and joints of left foot: Secondary | ICD-10-CM

## 2018-04-14 DIAGNOSIS — M6281 Muscle weakness (generalized): Secondary | ICD-10-CM

## 2018-04-14 DIAGNOSIS — M5441 Lumbago with sciatica, right side: Secondary | ICD-10-CM

## 2018-04-14 DIAGNOSIS — R262 Difficulty in walking, not elsewhere classified: Secondary | ICD-10-CM

## 2018-04-14 DIAGNOSIS — R293 Abnormal posture: Secondary | ICD-10-CM

## 2018-04-14 DIAGNOSIS — M79621 Pain in right upper arm: Secondary | ICD-10-CM

## 2018-04-14 DIAGNOSIS — M5442 Lumbago with sciatica, left side: Secondary | ICD-10-CM

## 2018-04-14 DIAGNOSIS — R2689 Other abnormalities of gait and mobility: Secondary | ICD-10-CM

## 2018-04-14 DIAGNOSIS — M25672 Stiffness of left ankle, not elsewhere classified: Secondary | ICD-10-CM

## 2018-04-14 NOTE — Therapy (Signed)
[facility] Outpatient Rehabilitation [facility] [address]  [address] [place], [place], [address] Phone: [phone]   Fax:  [phone]  Physical Therapy Treatment  Patient Details  Name: Angela Cook MRN: [id number] Date of Birth: 1955/10/24 Referring Provider (PT): Mayra Reel, MD   Encounter Date: 04/14/2018  PT End of Session - 04/14/18 0929    Visit Number  13    Number of Visits  25    Date for PT Re-Evaluation  05/13/18    Authorization Type  [employer] (no ionto)    PT Start Time  [phone]    PT Stop Time  1028    PT Time Calculation (min)  65 min    Activity Tolerance  Patient tolerated treatment well    Behavior During Therapy  [facility] for tasks assessed/performed       Past Medical History:  Diagnosis Date  . Arthritis   . Asthma    as a child, no issues since age 31  . Constipation due to opioid therapy   . Fibromyalgia    legs from back   . Pneumonia    as a child    Past Surgical History:  Procedure Laterality Date  . BACK SURGERY  07/05/13, 2015   lumbar 4-5, 2015- new bone graft inserted  . BUNIONECTOMY Left 2014  . CARPAL TUNNEL RELEASE Right 1986   left 1991  . CARPAL TUNNEL RELEASE Left   . CESAREAN SECTION  [phone]  . COLONOSCOPY    . LUMBAR LAMINECTOMY/DECOMPRESSION MICRODISCECTOMY Bilateral 01/07/2016   Procedure: DECOMPRESSIONL LUMBAR THREE-FOUR  WITH FORAMINOTOMIES ;  Surgeon: Barnett Abu, MD;  Location: [facility];  Service: Neurosurgery;  Laterality: Bilateral;  . ORIF ANKLE FRACTURE Left 02/07/2018   Procedure: OPEN REDUCTION INTERNAL FIXATION (ORIF) LEFT BIMALLEOLAR ANKLE FRACTURE;  Surgeon: Tarry Kos, MD;  Location: [facility];  Service: Orthopedics;  Laterality: Left;  . SPINAL CORD STIMULATOR IMPLANT    . TUBAL LIGATION  1986    There were no vitals filed for this visit.  Subjective Assessment - 04/14/18 0925    Subjective  Pt. noting buttocks pain has resolved over last two days however did feel buttocks pain  with prolonged sitting at work on Tuesday.  Wishes to focus on ankle in today's session.       Pertinent History  L ankle trimalleolar ankle fracture s/p ORIF on 02/07/18; L4-5 MIS TLIF with iliac crest bone graft 07/05/2013; Revision of posterior interbody fusion 07/06/14; Bil laminotomies and foraminotomies L3-L4 with decompression of L3 and L4 nerve roots 01/07/16; Thoracic laminectomies with placement of implanted spinal stimulator 05/25/17    Patient Stated Goals  "reduce pain in R arm and buttocks" & "walk normally"    Currently in Pain?  No/denies    Pain Score  0-No pain   up to 4/10 "stiffness" in mornings    Pain Location  Ankle    Pain Orientation  Left    Pain Descriptors / Indicators  --   "stiffness"   Pain Type  Acute pain    Pain Frequency  Intermittent    Aggravating Factors   Mornings when first wt. bearing,    Pain Relieving Factors  Subsides as day continues    Multiple Pain Sites  No    Pain Score  0   Pain rising to 6/10 during work on Tuesday   Pain Location  Buttocks    Pain Orientation  Right    Pain Descriptors /  Indicators  Throbbing    Pain Type  Chronic pain    Pain Radiating Towards  none today    Pain Onset  More than a month ago    Pain Frequency  Intermittent    Aggravating Factors   prolonged sitting                        [facility] Adult PT Treatment/Exercise - 04/14/18 0936      Neuro Re-ed    Neuro Re-ed Details   Forward/back, R/L wt. shift with eyes closed on airex pad 1 UE light support on counter and therapist supervision x 1 min each way       Lumbar Exercises: Stretches   Piriformis Stretch  Right;Left;30 seconds;1 rep    Piriformis Stretch Limitations  KTOS      Knee/Hip Exercises: Standing   Forward Step Up  Left;10 reps;Hand Hold: 1;1 set;Step Height: 6"    Forward Step Up Limitations  6" step in order to avoid "vaulting" off R LE on 8" step; 1 ski pole    Step Down  Left;10 reps;Step Height: 8";Hand Hold: 1    Step Down  Limitations  step-downs from 8" step working on eccentric control       Vasopneumatic   Number Minutes Vasopneumatic   15 minutes    Vasopnuematic Location   Ankle   L   Vasopneumatic Pressure  High    Vasopneumatic Temperature   coldest temp      Manual Therapy   Manual Therapy  Soft tissue mobilization    Manual therapy comments  long sitting     Soft tissue mobilization  medial ankle scar massage with free-up x 2 min    for improved comfort and tissue quality      Ankle Exercises: Seated   BAPS  Sitting;Weight;Level 3;15 reps;Level 2    BAPS Weights (lbs)  2.5# at P pole for PF/DF, and (lvl 2) EV/IV 2.5# at P pole     BAPS Limitations  DF/PF; IV/EV & CW/CCW (no weight for CW/CCW)       Ankle Exercises: Machines for Strengthening   Cybex Leg Press  L only 15# x 20 rpes; B bent knee calf raise 15# x 15 reps       Ankle Exercises: Standing   Side Shuffle (Round Trip)  R/L side stepping with yellow TB at forefoot 4 x 15 ft     Other Standing Ankle Exercises  l               PT Short Term Goals - 03/31/18 1130      PT SHORT TERM GOAL #1   Title  Independent with initial HEP    Status  Achieved      PT SHORT TERM GOAL #2   Title  Pt will verbalize/demonstrate understanding of appropriate posture and body mechanics needed for daily activities including work station set-up    Baseline  Patient to demonstrate     Status  Achieved        PT Long Term Goals - 03/31/18 1142      PT LONG TERM GOAL #1   Title  Independent with ongoing/advanced HEP    Status  On-going      PT LONG TERM GOAL #2   Title  Pt will report ability to sit for >/= 30 minutes w/o limitation due to buttock pain to allow for increased focus at work and improved tolerance for driving  Status  On-going      PT LONG TERM GOAL #3   Title  Pt will report ability to complete work tasks on computer inlcuding use of mouse with pain </= 3/10    Status  On-going      PT LONG TERM GOAL #4   Title  L  ankle ROM WFL w/o increased pain to allow for normal gait pattern    Status  On-going      PT LONG TERM GOAL #5   Title  L ankle strength >/= 4/5 for improved gait stability    Status  On-going      PT LONG TERM GOAL #6   Title  Pt will ambulate with normal gait pattern w/o AD w/o evidence of L ankle instability    Status  On-going      PT LONG TERM GOAL #7   Title  Pt will return to full-time status at work w/o limitation due L ankle LOM, weakness or pain    Status  On-going            Plan - 04/14/18 0929    Clinical Impression Statement  Angela Cook reporting she wishes to focus on ankle today and has felt some relief from hip/buttocks pain since last visit.  Session focusing on proprioception and strengthening to pt. tolerance for L LE for improved tolerance for functional activities.  Manual therapy focusing on scar massage as pt. still with complaint of frequent discomfort at L medial ankle and STM/TPR to R lateral UE in area of ongoing tenderness.  Pt. still with palpable TP's in R UE, which are slow to respond to manual therapy and may benefit from consideration of DN in this area.  Ended visit with ice/compression to L ankle to decrease post-exercise soreness and swelling.      PT Treatment/Interventions  Patient/family education;Manual techniques;Passive range of motion;Dry needling;Taping;Scar mobilization;Neuromuscular re-education;Therapeutic exercise;Therapeutic activities;Functional mobility training;Gait training;Stair training;Balance training;ADLs/Self Care Home Management;Ultrasound;Moist Heat;Electrical Stimulation;Cryotherapy;Vasopneumatic Device;Iontophoresis 4mg /ml Dexamethasone    PT Next Visit Plan  continue ankle 2x/wk, and shoulder back 1x/wk.    Consulted and Agree with Plan of Care  Patient       Patient will benefit from skilled therapeutic intervention in order to improve the following deficits and impairments:  Pain, Increased muscle spasms, Impaired  flexibility, Hypomobility, Decreased range of motion, Decreased strength, Difficulty walking, Abnormal gait, Decreased activity tolerance, Decreased endurance, Decreased scar mobility, Postural dysfunction, Improper body mechanics  Visit Diagnosis: Stiffness of left ankle, not elsewhere classified  Pain in left ankle and joints of left foot  Difficulty in walking, not elsewhere classified  Other abnormalities of gait and mobility  Muscle weakness (generalized)  Chronic bilateral low back pain with bilateral sciatica  Pain of right upper arm  Cramp and spasm  Abnormal posture     Problem List Patient Active Problem List   Diagnosis Date Noted  . Closed displaced trimalleolar fracture of lower leg with routine healing 02/17/2018  . Lumbar stenosis with neurogenic claudication 01/07/2016  . Lumbar pseudoarthrosis 07/06/2014   Kermit Balo, PTA 04/14/18 12:23 PM  [facility] Outpatient Rehabilitation [facility] [address]  [address] [place], [place], [address] Phone: [phone]   Fax:  [phone]  Name: Angela Cook MRN: [id number] Date of Birth: January 12, 1956

## 2018-04-18 ENCOUNTER — Encounter: Payer: Self-pay | Admitting: Physical Therapy

## 2018-04-18 ENCOUNTER — Ambulatory Visit: Payer: 59 | Admitting: Physical Therapy

## 2018-04-18 DIAGNOSIS — M5441 Lumbago with sciatica, right side: Secondary | ICD-10-CM

## 2018-04-18 DIAGNOSIS — R2689 Other abnormalities of gait and mobility: Secondary | ICD-10-CM

## 2018-04-18 DIAGNOSIS — R293 Abnormal posture: Secondary | ICD-10-CM

## 2018-04-18 DIAGNOSIS — G8929 Other chronic pain: Secondary | ICD-10-CM

## 2018-04-18 DIAGNOSIS — M25672 Stiffness of left ankle, not elsewhere classified: Secondary | ICD-10-CM

## 2018-04-18 DIAGNOSIS — M79621 Pain in right upper arm: Secondary | ICD-10-CM

## 2018-04-18 DIAGNOSIS — M6281 Muscle weakness (generalized): Secondary | ICD-10-CM

## 2018-04-18 DIAGNOSIS — M25572 Pain in left ankle and joints of left foot: Secondary | ICD-10-CM

## 2018-04-18 DIAGNOSIS — M5442 Lumbago with sciatica, left side: Secondary | ICD-10-CM | POA: Diagnosis not present

## 2018-04-18 DIAGNOSIS — R262 Difficulty in walking, not elsewhere classified: Secondary | ICD-10-CM

## 2018-04-18 DIAGNOSIS — R252 Cramp and spasm: Secondary | ICD-10-CM

## 2018-04-18 NOTE — Therapy (Signed)
[facility] Outpatient Rehabilitation [facility] [address]  [address] [place], [place], [address] Phone: [phone]   Fax:  [phone]  Physical Therapy Treatment  Patient Details  Name: Angela Cook MRN: [id number] Date of Birth: 03-12-1956 Referring Provider (PT): Mayra Reel, MD   Encounter Date: 04/18/2018  PT End of Session - 04/18/18 0932    Visit Number  14    Number of Visits  25    Date for PT Re-Evaluation  05/13/18    Authorization Type  [employer]et[employer] (no ionto)    PT Start Time  0932    PT Stop Time  1031    PT Time Calculation (min)  59 min    Activity Tolerance  Patient tolerated treatment well    Behavior During Therapy  [facility] for tasks assessed/performed       Past Medical History:  Diagnosis Date  . Arthritis   . Asthma    as a child, no issues since age 72  . Constipation due to opioid therapy   . Fibromyalgia    legs from back   . Pneumonia    as a child    Past Surgical History:  Procedure Laterality Date  . BACK SURGERY  07/05/13, 2015   lumbar 4-5, 2015- new bone graft inserted  . BUNIONECTOMY Left 2014  . CARPAL TUNNEL RELEASE Right 1986   left 1991  . CARPAL TUNNEL RELEASE Left   . CESAREAN SECTION  [phone]  . COLONOSCOPY    . LUMBAR LAMINECTOMY/DECOMPRESSION MICRODISCECTOMY Bilateral 01/07/2016   Procedure: DECOMPRESSIONL LUMBAR THREE-FOUR  WITH FORAMINOTOMIES ;  Surgeon: Barnett Abu, MD;  Location: [facility];  Service: Neurosurgery;  Laterality: Bilateral;  . ORIF ANKLE FRACTURE Left 02/07/2018   Procedure: OPEN REDUCTION INTERNAL FIXATION (ORIF) LEFT BIMALLEOLAR ANKLE FRACTURE;  Surgeon: Tarry Kos, MD;  Location: [facility];  Service: Orthopedics;  Laterality: Left;  . SPINAL CORD STIMULATOR IMPLANT    . TUBAL LIGATION  1986    There were no vitals filed for this visit.  Subjective Assessment - 04/18/18 0936    Subjective  Doing well today - no new concerns. Able to walk ~1 mile [place] on [place] and  did well, but went mall shopping with her granddaughter yesterday and had to come home and ice her ankle.    Pertinent History  L ankle trimalleolar ankle fracture s/p ORIF on 02/07/18; L4-5 MIS TLIF with iliac crest bone graft 07/05/2013; Revision of posterior interbody fusion 07/06/14; Bil laminotomies and foraminotomies L3-L4 with decompression of L3 and L4 nerve roots 01/07/16; Thoracic laminectomies with placement of implanted spinal stimulator 05/25/17    Patient Stated Goals  "reduce pain in R arm and buttocks" & "walk normally"    Currently in Pain?  No/denies    Pain Onset  --                       [employer] Adult PT Treatment/Exercise - 04/18/18 0932      Exercises   Exercises  Ankle      Modalities   Modalities  Vasopneumatic      Vasopneumatic   Number Minutes Vasopneumatic   15 minutes    Vasopnuematic Location   Ankle   L   Vasopneumatic Pressure  High    Vasopneumatic Temperature   coldest temp      Ankle Exercises: Aerobic   Nustep  L4 x 6 min (LE only)  Ankle Exercises: Standing   Vector Stance  Left;5 reps   2 sets   Vector Stance Limitations  4 way clocks to colored discs/balance pebbles - 1st set on solid ground, 2nd set on blue foam oval - intermittent 1 pole A    Rocker Board  5 minutes    Rocker Board Limitations  lateral & heel toes wt shifts; attempt to maintain static stance balance - only able to maintain for ~10 sec intervals with most common LOB posteriorly    Other Standing Ankle Exercises  Mini-tramp - Minisquats x10; Heel & toe raises x20; L SLS 3 x 15 sec - intermittent UE support      Ankle Exercises: Seated   BAPS  Sitting;Level 3;10 reps;Weight    BAPS Weights (lbs)  5# at P, PL & AL poles x10 DF/PF each pole; 2.5# at PM & PL poles x10 DF/PF each pole    BAPS Limitations  IV/EV with 2.5# at AM & PM poles x 10 each; CW/CCW w/o added weight x 10 each               PT Short Term Goals - 03/31/18 1130      PT SHORT TERM  GOAL #1   Title  Independent with initial HEP    Status  Achieved      PT SHORT TERM GOAL #2   Title  Pt will verbalize/demonstrate understanding of appropriate posture and body mechanics needed for daily activities including work station set-up    Baseline  Patient to demonstrate     Status  Achieved        PT Long Term Goals - 03/31/18 1142      PT LONG TERM GOAL #1   Title  Independent with ongoing/advanced HEP    Status  On-going      PT LONG TERM GOAL #2   Title  Pt will report ability to sit for >/= 30 minutes w/o limitation due to buttock pain to allow for increased focus at work and improved tolerance for driving    Status  On-going      PT LONG TERM GOAL #3   Title  Pt will report ability to complete work tasks on computer inlcuding use of mouse with pain </= 3/10    Status  On-going      PT LONG TERM GOAL #4   Title  L ankle ROM WFL w/o increased pain to allow for normal gait pattern    Status  On-going      PT LONG TERM GOAL #5   Title  L ankle strength >/= 4/5 for improved gait stability    Status  On-going      PT LONG TERM GOAL #6   Title  Pt will ambulate with normal gait pattern w/o AD w/o evidence of L ankle instability    Status  On-going      PT LONG TERM GOAL #7   Title  Pt will return to full-time status at work w/o limitation due L ankle LOM, weakness or pain    Status  On-going            Plan - 04/18/18 0937    Clinical Impression Statement  Angela Cook demonstrating improving L ankle functional ROM and control as evidenced by good tolerance for progression of BAPS level and resistance as well as increased control with functional standing exercises. She continues to demonstrate limited balance on unstable surfaces, with most common LOB posteriorly, requriring close guarding and  occasional assist to prevent fall with activities such as standing on round rocker board.    Rehab Potential  Good    PT Treatment/Interventions  Patient/family  education;Manual techniques;Passive range of motion;Dry needling;Taping;Scar mobilization;Neuromuscular re-education;Therapeutic exercise;Therapeutic activities;Functional mobility training;Gait training;Stair training;Balance training;ADLs/Self Care Home Management;Ultrasound;Moist Heat;Electrical Stimulation;Cryotherapy;Vasopneumatic Device;Iontophoresis 4mg /ml Dexamethasone    PT Next Visit Plan  continue ankle 2x/wk, and shoulder back 1x/wk.    Consulted and Agree with Plan of Care  Patient       Patient will benefit from skilled therapeutic intervention in order to improve the following deficits and impairments:  Pain, Increased muscle spasms, Impaired flexibility, Hypomobility, Decreased range of motion, Decreased strength, Difficulty walking, Abnormal gait, Decreased activity tolerance, Decreased endurance, Decreased scar mobility, Postural dysfunction, Improper body mechanics  Visit Diagnosis: Stiffness of left ankle, not elsewhere classified  Pain in left ankle and joints of left foot  Difficulty in walking, not elsewhere classified  Other abnormalities of gait and mobility  Muscle weakness (generalized)  Chronic bilateral low back pain with bilateral sciatica  Pain of right upper arm  Cramp and spasm  Abnormal posture     Problem List Patient Active Problem List   Diagnosis Date Noted  . Closed displaced trimalleolar fracture of lower leg with routine healing 02/17/2018  . Lumbar stenosis with neurogenic claudication 01/07/2016  . Lumbar pseudoarthrosis 07/06/2014    Marry Guan, PT, MPT 04/18/2018, 12:32 PM  [facility] [address]  [address] [place], [place], [address] Phone: [phone]   Fax:  [phone]  Name: Angela Cook MRN: [id number] Date of Birth: 11-14-1955

## 2018-04-19 ENCOUNTER — Ambulatory Visit: Payer: 59 | Attending: Family Medicine

## 2018-04-19 DIAGNOSIS — R262 Difficulty in walking, not elsewhere classified: Secondary | ICD-10-CM | POA: Diagnosis present

## 2018-04-19 DIAGNOSIS — M25672 Stiffness of left ankle, not elsewhere classified: Secondary | ICD-10-CM | POA: Diagnosis not present

## 2018-04-19 DIAGNOSIS — R2689 Other abnormalities of gait and mobility: Secondary | ICD-10-CM | POA: Insufficient documentation

## 2018-04-19 DIAGNOSIS — M25572 Pain in left ankle and joints of left foot: Secondary | ICD-10-CM | POA: Diagnosis present

## 2018-04-19 DIAGNOSIS — M5441 Lumbago with sciatica, right side: Secondary | ICD-10-CM | POA: Diagnosis present

## 2018-04-19 DIAGNOSIS — M6281 Muscle weakness (generalized): Secondary | ICD-10-CM

## 2018-04-19 DIAGNOSIS — G8929 Other chronic pain: Secondary | ICD-10-CM

## 2018-04-19 DIAGNOSIS — M79621 Pain in right upper arm: Secondary | ICD-10-CM | POA: Diagnosis present

## 2018-04-19 DIAGNOSIS — M5442 Lumbago with sciatica, left side: Secondary | ICD-10-CM | POA: Insufficient documentation

## 2018-04-19 DIAGNOSIS — R293 Abnormal posture: Secondary | ICD-10-CM | POA: Diagnosis present

## 2018-04-19 DIAGNOSIS — R252 Cramp and spasm: Secondary | ICD-10-CM

## 2018-04-19 NOTE — Therapy (Signed)
[facility] [address]  [place], [place], [address] Phone: [phone]   Fax:  [phone]  Physical Therapy Treatment  Patient Details  Name: CORTINA VULTAGGIO MRN: [id number] Date of Birth: April 12, 1956 Referring Provider (PT): Azucena Cecil, MD   Encounter Date: 04/19/2018  PT End of Session - 04/19/18 5397    Visit Number  15    Number of Visits  25    Date for PT Re-Evaluation  05/13/18    Authorization Type  Aetna (no ionto)    PT Start Time  1359    PT Stop Time  1455   ende visit with 10 min moist heat   PT Time Calculation (min)  56 min    Activity Tolerance  Patient tolerated treatment well    Behavior During Therapy  [facility] for tasks assessed/performed       Past Medical History:  Diagnosis Date  . Arthritis   . Asthma    as a child, no issues since age 65  . Constipation due to opioid therapy   . Fibromyalgia    legs from back   . Pneumonia    as a child    Past Surgical History:  Procedure Laterality Date  . BACK SURGERY  07/05/13, 2015   lumbar 4-5, 2015- new bone graft inserted  . BUNIONECTOMY Left 2014  . CARPAL TUNNEL RELEASE Right 1986   left 1991  . CARPAL TUNNEL RELEASE Left   . CESAREAN SECTION  [phone]  . COLONOSCOPY    . LUMBAR LAMINECTOMY/DECOMPRESSION MICRODISCECTOMY Bilateral 01/07/2016   Procedure: DECOMPRESSIONL LUMBAR THREE-FOUR  WITH FORAMINOTOMIES ;  Surgeon: Kristeen Miss, MD;  Location: [place] [facility];  Service: Neurosurgery;  Laterality: Bilateral;  . ORIF ANKLE FRACTURE Left 02/07/2018   Procedure: OPEN REDUCTION INTERNAL FIXATION (ORIF) LEFT BIMALLEOLAR ANKLE FRACTURE;  Surgeon: Leandrew Koyanagi, MD;  Location: [place];  Service: Orthopedics;  Laterality: Left;  . SPINAL CORD STIMULATOR IMPLANT    . TUBAL LIGATION  1986    There were no vitals filed for this visit.  Subjective Assessment - 04/19/18 1402    Subjective  Pt. requesting to work on buttocks today.      Pertinent History  L ankle trimalleolar ankle fracture s/p ORIF on 02/07/18; L4-5 MIS TLIF with iliac crest bone graft 07/05/2013; Revision of posterior interbody fusion 07/06/14; Bil laminotomies and foraminotomies L3-L4 with decompression of L3 and L4 nerve roots 01/07/16; Thoracic laminectomies with placement of implanted spinal stimulator 05/25/17    How long can you sit comfortably?  10 min     How long can you stand comfortably?  5 min     How long can you walk comfortably?  40 min     Patient Stated Goals  "reduce pain in R arm and buttocks" & "walk normally"    Currently in Pain?  Yes    Pain Score  6     Pain Location  Buttocks    Pain Orientation  Left    Pain Descriptors / Indicators  --   "Pressure"   Pain Type  Acute pain    Multiple Pain Sites  Yes    Pain Score  5    Pain Location  Arm    Pain Orientation  Right    Pain Descriptors / Indicators  Aching    Pain Type  Chronic pain    Pain Onset  More than a month ago    Pain  Frequency  Constant    Aggravating Factors   bringing arm out to side                        [facility] Adult PT Treatment/Exercise - 04/19/18 1409      Lumbar Exercises: Stretches   Passive Hamstring Stretch  Right;2 reps;30 seconds    Passive Hamstring Stretch Limitations  supine     Single Knee to Chest Stretch  Right;3 reps;30 seconds    Single Knee to Chest Stretch Limitations  supine     Hip Flexor Stretch  Right;1 rep;30 seconds    Hip Flexor Stretch Limitations  in mod thomas position       Lumbar Exercises: Supine   Bridge  5 seconds   x 12 reps    Bridge Limitations  partial ROM       Knee/Hip Exercises: Standing   Hip Flexion  Right;Left;10 reps;Knee straight;Stengthening    Hip Flexion Limitations  with yellow looped TB at ankles; standing on airex pad     Hip Abduction  Right;Left;10 reps;Knee straight    Abduction Limitations  with yellow looped TB at ankles; on airex pad at counter     Hip Extension  Right;Left;10  reps;Knee straight;Stengthening    Extension Limitations  with yellow looped at ankle; on airex pad       Modalities   Modalities  Moist Heat      Moist Heat Therapy   Number Minutes Moist Heat  10 Minutes    Moist Heat Location  Hip   R buttocks               PT Short Term Goals - 03/31/18 1130      PT SHORT TERM GOAL #1   Title  Independent with initial HEP    Status  Achieved      PT SHORT TERM GOAL #2   Title  Pt will verbalize/demonstrate understanding of appropriate posture and body mechanics needed for daily activities including work station set-up    Baseline  Patient to demonstrate     Status  Achieved        PT Long Term Goals - 04/19/18 1432      PT LONG TERM GOAL #1   Title  Independent with ongoing/advanced HEP    Status  Partially Met      PT LONG TERM GOAL #2   Title  Pt will report ability to sit for >/= 30 minutes w/o limitation due to buttock pain to allow for increased focus at work and improved tolerance for driving    Status  Partially Met   only able to tolerated 30 min with driving      PT LONG TERM GOAL #3   Title  Pt will report ability to complete work tasks on computer inlcuding use of mouse with pain </= 3/10    Status  On-going      PT LONG TERM GOAL #4   Title  L ankle ROM WFL w/o increased pain to allow for normal gait pattern    Status  On-going      PT LONG TERM GOAL #5   Title  L ankle strength >/= 4/5 for improved gait stability    Status  On-going      PT LONG TERM GOAL #6   Title  Pt will ambulate with normal gait pattern w/o AD w/o evidence of L ankle instability    Status  On-going  PT LONG TERM GOAL #7   Title  Pt will return to full-time status at work w/o limitation due L ankle LOM, weakness or pain    Status  On-going            Plan - 04/19/18 1403    Clinical Impression Statement  Jeani Hawking primary complaint today was R buttocks/posterior leg pain which was addressed with LE stretching and STM with  some relief noted.  Pt. tolerated all proximal hip strengthening activities performed on complaint surface to address ankle stability well today.  Ended visit with moist heat to R buttocks in area of tenderness for hopeful improvement in comfort.  Will continue to progress toward goals.      PT Treatment/Interventions  Patient/family education;Manual techniques;Passive range of motion;Dry needling;Taping;Scar mobilization;Neuromuscular re-education;Therapeutic exercise;Therapeutic activities;Functional mobility training;Gait training;Stair training;Balance training;ADLs/Self Care Home Management;Ultrasound;Moist Heat;Electrical Stimulation;Cryotherapy;Vasopneumatic Device;Iontophoresis 55m/ml Dexamethasone    Consulted and Agree with Plan of Care  Patient       Patient will benefit from skilled therapeutic intervention in order to improve the following deficits and impairments:  Pain, Increased muscle spasms, Impaired flexibility, Hypomobility, Decreased range of motion, Decreased strength, Difficulty walking, Abnormal gait, Decreased activity tolerance, Decreased endurance, Decreased scar mobility, Postural dysfunction, Improper body mechanics  Visit Diagnosis: Stiffness of left ankle, not elsewhere classified  Pain in left ankle and joints of left foot  Difficulty in walking, not elsewhere classified  Other abnormalities of gait and mobility  Muscle weakness (generalized)  Chronic bilateral low back pain with bilateral sciatica  Pain of right upper arm  Cramp and spasm  Abnormal posture     Problem List Patient Active Problem List   Diagnosis Date Noted  . Closed displaced trimalleolar fracture of lower leg with routine healing 02/17/2018  . Lumbar stenosis with neurogenic claudication 01/07/2016  . Lumbar pseudoarthrosis 07/06/2014    MBess Harvest PTA 04/19/18 6:45 PM   [facility] [address] [place] [address]Phone: [phone]  Fax:  [phone] Name: LTAMIEKA RANCOURTMRN: [id number] of Birth: [birth year]

## 2018-04-21 ENCOUNTER — Encounter: Payer: Self-pay | Admitting: Physical Therapy

## 2018-04-21 ENCOUNTER — Ambulatory Visit: Payer: 59 | Admitting: Physical Therapy

## 2018-04-21 DIAGNOSIS — M25672 Stiffness of left ankle, not elsewhere classified: Secondary | ICD-10-CM

## 2018-04-21 DIAGNOSIS — R2689 Other abnormalities of gait and mobility: Secondary | ICD-10-CM

## 2018-04-21 DIAGNOSIS — M5442 Lumbago with sciatica, left side: Secondary | ICD-10-CM

## 2018-04-21 DIAGNOSIS — M25572 Pain in left ankle and joints of left foot: Secondary | ICD-10-CM

## 2018-04-21 DIAGNOSIS — R262 Difficulty in walking, not elsewhere classified: Secondary | ICD-10-CM

## 2018-04-21 DIAGNOSIS — M5441 Lumbago with sciatica, right side: Secondary | ICD-10-CM

## 2018-04-21 DIAGNOSIS — M79621 Pain in right upper arm: Secondary | ICD-10-CM

## 2018-04-21 DIAGNOSIS — R252 Cramp and spasm: Secondary | ICD-10-CM

## 2018-04-21 DIAGNOSIS — G8929 Other chronic pain: Secondary | ICD-10-CM

## 2018-04-21 DIAGNOSIS — R293 Abnormal posture: Secondary | ICD-10-CM

## 2018-04-21 DIAGNOSIS — M6281 Muscle weakness (generalized): Secondary | ICD-10-CM

## 2018-04-21 NOTE — Therapy (Signed)
[facility] [address]  [place], [place], [address] Phone: [phone]   Fax:  [phone]  Physical Therapy Treatment  Patient Details  Name: Angela Cook MRN: [id number] Date of Birth: [birth year] Referring Provider (PT): Azucena Cecil, MD   Encounter Date: 04/21/2018  PT End of Session - 04/21/18 0851    Visit Number  16    Number of Visits  25    Date for PT Re-Evaluation  05/13/18    Authorization Type  Aetna (no ionto)    PT Start Time  0845    PT Stop Time  0942    PT Time Calculation (min)  57 min    Activity Tolerance  Patient tolerated treatment well    Behavior During Therapy  [facility] for tasks assessed/performed       Past Medical History:  Diagnosis Date  . Arthritis   . Asthma    as a child, no issues since age 51  . Constipation due to opioid therapy   . Fibromyalgia    legs from back   . Pneumonia    as a child    Past Surgical History:  Procedure Laterality Date  . BACK SURGERY  07/05/13, 2015   lumbar 4-5, 2015- new bone graft inserted  . BUNIONECTOMY Left 2014  . CARPAL TUNNEL RELEASE Right 1986   left 1991  . CARPAL TUNNEL RELEASE Left   . CESAREAN SECTION  [phone]  . COLONOSCOPY    . LUMBAR LAMINECTOMY/DECOMPRESSION MICRODISCECTOMY Bilateral 01/07/2016   Procedure: DECOMPRESSIONL LUMBAR THREE-FOUR  WITH FORAMINOTOMIES ;  Surgeon: Kristeen Miss, MD;  Location: [place] [facility];  Service: Neurosurgery;  Laterality: Bilateral;  . ORIF ANKLE FRACTURE Left 02/07/2018   Procedure: OPEN REDUCTION INTERNAL FIXATION (ORIF) LEFT BIMALLEOLAR ANKLE FRACTURE;  Surgeon: Leandrew Koyanagi, MD;  Location: [place];  Service: Orthopedics;  Laterality: Left;  . SPINAL CORD STIMULATOR IMPLANT    . TUBAL LIGATION  1986    There were no vitals filed for this visit.  Subjective Assessment - 04/21/18 0850    Subjective  pt. reporting ankle stiffness today however resolution of buttocks pain.      Pertinent  History  L ankle trimalleolar ankle fracture s/p ORIF on 02/07/18; L4-5 MIS TLIF with iliac crest bone graft 07/05/2013; Revision of posterior interbody fusion 07/06/14; Bil laminotomies and foraminotomies L3-L4 with decompression of L3 and L4 nerve roots 01/07/16; Thoracic laminectomies with placement of implanted spinal stimulator 05/25/17    Patient Stated Goals  "reduce pain in R arm and buttocks" & "walk normally"    Currently in Pain?  No/denies    Pain Score  0-No pain    Multiple Pain Sites  No                       [facility] Adult PT Treatment/Exercise - 04/21/18 0845      Exercises   Exercises  Ankle      Modalities   Modalities  Vasopneumatic      Vasopneumatic   Number Minutes Vasopneumatic   15 minutes    Vasopnuematic Location   Ankle   L   Vasopneumatic Pressure  High    Vasopneumatic Temperature   coldest temp      Ankle Exercises: Aerobic   Recumbent Bike  L1 x 6 min      Ankle Exercises: Standing   Vector Stance  Left    Vector Stance Limitations  L SLS on blue foam oval + 4 way hip-kickers with yellow TB; 1 pole A    Heel Raises  Both;15 reps;3 seconds    Heel Raises Limitations  B con/L ecc; negative heel on back of UBE    Balance Beam  Foam balance beam - B sidestepping - feet centered on beam x 4, heels on beam x 2, toes on beam x 2; Tandem gait - fwd x 4, back x 2; intermittent HHA    Side Shuffle (Round Trip)  R/L side stepping with yellow TB at forefoot 2 x 30 ft     Other Standing Ankle Exercises  L lateral & fwd step-down with toe touch from 4" step + blue foam oval 2 x 10 each; 1 pole A             PT Education - 04/21/18 0930    Education Details  Ankle HEP update    Person(s) Educated  Patient    Methods  Explanation;Demonstration;Handout    Comprehension  Verbalized understanding;Returned demonstration       PT Short Term Goals - 03/31/18 1130      PT SHORT TERM GOAL #1   Title  Independent with initial HEP    Status   Achieved      PT SHORT TERM GOAL #2   Title  Pt will verbalize/demonstrate understanding of appropriate posture and body mechanics needed for daily activities including work station set-up    Baseline  Patient to demonstrate     Status  Achieved        PT Long Term Goals - 04/19/18 1432      PT LONG TERM GOAL #1   Title  Independent with ongoing/advanced HEP    Status  Partially Met      PT LONG TERM GOAL #2   Title  Pt will report ability to sit for >/= 30 minutes w/o limitation due to buttock pain to allow for increased focus at work and improved tolerance for driving    Status  Partially Met   only able to tolerated 30 min with driving      PT LONG TERM GOAL #3   Title  Pt will report ability to complete work tasks on computer inlcuding use of mouse with pain </= 3/10    Status  On-going      PT LONG TERM GOAL #4   Title  L ankle ROM WFL w/o increased pain to allow for normal gait pattern    Status  On-going      PT LONG TERM GOAL #5   Title  L ankle strength >/= 4/5 for improved gait stability    Status  On-going      PT LONG TERM GOAL #6   Title  Pt will ambulate with normal gait pattern w/o AD w/o evidence of L ankle instability    Status  On-going      PT LONG TERM GOAL #7   Title  Pt will return to full-time status at work w/o limitation due L ankle LOM, weakness or pain    Status  On-going            Plan - 04/21/18 0853    Clinical Impression Statement  Jeani Hawking reporting improvement in R buutock/posterior leg pain following last visit allowing for continued focus on L ankle rehab. Progressed proprioceptive training and ankle strengthening with good tolerance, but continued instability and decreased balance necessitating intermittent UE assist and/or close guarding during balance and  proprioceptive training. HEP update to reflect therapeutic exercise porgression.    Rehab Potential  Good    PT Treatment/Interventions  Patient/family education;Manual  techniques;Passive range of motion;Dry needling;Taping;Scar mobilization;Neuromuscular re-education;Therapeutic exercise;Therapeutic activities;Functional mobility training;Gait training;Stair training;Balance training;ADLs/Self Care Home Management;Ultrasound;Moist Heat;Electrical Stimulation;Cryotherapy;Vasopneumatic Device;Iontophoresis 12m/ml Dexamethasone    Consulted and Agree with Plan of Care  Patient       Patient will benefit from skilled therapeutic intervention in order to improve the following deficits and impairments:  Pain, Increased muscle spasms, Impaired flexibility, Hypomobility, Decreased range of motion, Decreased strength, Difficulty walking, Abnormal gait, Decreased activity tolerance, Decreased endurance, Decreased scar mobility, Postural dysfunction, Improper body mechanics  Visit Diagnosis: Stiffness of left ankle, not elsewhere classified  Pain in left ankle and joints of left foot  Difficulty in walking, not elsewhere classified  Other abnormalities of gait and mobility  Muscle weakness (generalized)  Chronic bilateral low back pain with bilateral sciatica  Pain of right upper arm  Cramp and spasm  Abnormal posture     Problem List Patient Active Problem List   Diagnosis Date Noted  . Closed displaced trimalleolar fracture of lower leg with routine healing 02/17/2018  . Lumbar stenosis with neurogenic claudication 01/07/2016  . Lumbar pseudoarthrosis 07/06/2014    JPercival Spanish PT, MPT 04/21/2018, 12:35 PM  [facility] [place] [address]Phone: [phone]  Fax:  [phone] Name: LERINNE GILLENTINEMRN: [id number] of Birth: [birth year]

## 2018-04-25 ENCOUNTER — Ambulatory Visit: Payer: 59 | Admitting: Physical Therapy

## 2018-04-25 DIAGNOSIS — R2689 Other abnormalities of gait and mobility: Secondary | ICD-10-CM

## 2018-04-25 DIAGNOSIS — M25672 Stiffness of left ankle, not elsewhere classified: Secondary | ICD-10-CM

## 2018-04-25 DIAGNOSIS — R262 Difficulty in walking, not elsewhere classified: Secondary | ICD-10-CM

## 2018-04-25 DIAGNOSIS — M79621 Pain in right upper arm: Secondary | ICD-10-CM

## 2018-04-25 DIAGNOSIS — G8929 Other chronic pain: Secondary | ICD-10-CM

## 2018-04-25 DIAGNOSIS — R293 Abnormal posture: Secondary | ICD-10-CM

## 2018-04-25 DIAGNOSIS — M6281 Muscle weakness (generalized): Secondary | ICD-10-CM

## 2018-04-25 DIAGNOSIS — R252 Cramp and spasm: Secondary | ICD-10-CM

## 2018-04-25 DIAGNOSIS — M25572 Pain in left ankle and joints of left foot: Secondary | ICD-10-CM

## 2018-04-25 DIAGNOSIS — M5442 Lumbago with sciatica, left side: Secondary | ICD-10-CM

## 2018-04-25 DIAGNOSIS — M5441 Lumbago with sciatica, right side: Secondary | ICD-10-CM

## 2018-04-25 NOTE — Therapy (Signed)
[facility] [address]  [place], [place], [address] Phone: [phone]   Fax:  [phone]  Physical Therapy Treatment  Patient Details  Name: Angela Cook MRN: [id number] Date of Birth: September 28, 1955 Referring Provider (PT): Azucena Cecil, MD   Encounter Date: 04/25/2018  PT End of Session - 04/25/18 0850    Visit Number  17    Number of Visits  25    Date for PT Re-Evaluation  05/13/18    Authorization Type  Aetna (no ionto)    PT Start Time  [phone]    PT Stop Time  0934    PT Time Calculation (min)  44 min    Activity Tolerance  Patient tolerated treatment well    Behavior During Therapy  [facility] for tasks assessed/performed       Past Medical History:  Diagnosis Date  . Arthritis   . Asthma    as a child, no issues since age 69  . Constipation due to opioid therapy   . Fibromyalgia    legs from back   . Pneumonia    as a child    Past Surgical History:  Procedure Laterality Date  . BACK SURGERY  07/05/13, 2015   lumbar 4-5, 2015- new bone graft inserted  . BUNIONECTOMY Left 2014  . CARPAL TUNNEL RELEASE Right 1986   left 1991  . CARPAL TUNNEL RELEASE Left   . CESAREAN SECTION  [phone]  . COLONOSCOPY    . LUMBAR LAMINECTOMY/DECOMPRESSION MICRODISCECTOMY Bilateral 01/07/2016   Procedure: DECOMPRESSIONL LUMBAR THREE-FOUR  WITH FORAMINOTOMIES ;  Surgeon: Kristeen Miss, MD;  Location: [place] [facility];  Service: Neurosurgery;  Laterality: Bilateral;  . ORIF ANKLE FRACTURE Left 02/07/2018   Procedure: OPEN REDUCTION INTERNAL FIXATION (ORIF) LEFT BIMALLEOLAR ANKLE FRACTURE;  Surgeon: Leandrew Koyanagi, MD;  Location: [place];  Service: Orthopedics;  Laterality: Left;  . SPINAL CORD STIMULATOR IMPLANT    . TUBAL LIGATION  1986    There were no vitals filed for this visit.  Subjective Assessment - 04/25/18 0853    Subjective  Pt reports she was able to put on her tennis shoes and walk a mile this weekend w/o  issues with her L foot/ankle. Would like to focus on her shoulder today as anle seems to be doing well. Notes increased pain and difficulty with tasks such applying mascara.    Pertinent History  L ankle trimalleolar ankle fracture s/p ORIF on 02/07/18; L4-5 MIS TLIF with iliac crest bone graft 07/05/2013; Revision of posterior interbody fusion 07/06/14; Bil laminotomies and foraminotomies L3-L4 with decompression of L3 and L4 nerve roots 01/07/16; Thoracic laminectomies with placement of implanted spinal stimulator 05/25/17    Patient Stated Goals  "reduce pain in R arm and buttocks" & "walk normally"    Currently in Pain?  Yes    Pain Score  7     Pain Location  Shoulder    Pain Orientation  Right    Pain Descriptors / Indicators  Aching    Pain Type  Chronic pain                       [facility] Adult PT Treatment/Exercise - 04/25/18 0850      Exercises   Exercises  Shoulder      Shoulder Exercises: ROM/Strengthening   Nustep  L5 x 6 min (UE/LE)      Manual Therapy   Manual Therapy  Soft  tissue mobilization;Myofascial release;Taping    Soft tissue mobilization  R shoulder STM to UT, ant/mid deltoid, biceps, brachialis & teres group    Myofascial Release  manual TPR to R UT, ant/mod deltoid & biceps      Kinesiotix   Inhibit Muscle   R shoulder impingement/deltoid inhibition       Trigger Point Dry Needling - 04/25/18 0850    Consent Given?  Yes    Muscles Treated Upper Body  Upper trapezius   R ant/mid deltoid & biceps: + twitch response/dec tension   Upper Trapezius Response  Twitch reponse elicited;Palpable increased muscle length   Rt            PT Short Term Goals - 03/31/18 1130      PT SHORT TERM GOAL #1   Title  Independent with initial HEP    Status  Achieved      PT SHORT TERM GOAL #2   Title  Pt will verbalize/demonstrate understanding of appropriate posture and body mechanics needed for daily activities including work station set-up     Baseline  Patient to demonstrate     Status  Achieved        PT Long Term Goals - 04/19/18 1432      PT LONG TERM GOAL #1   Title  Independent with ongoing/advanced HEP    Status  Partially Met      PT LONG TERM GOAL #2   Title  Pt will report ability to sit for >/= 30 minutes w/o limitation due to buttock pain to allow for increased focus at work and improved tolerance for driving    Status  Partially Met   only able to tolerated 30 min with driving      PT LONG TERM GOAL #3   Title  Pt will report ability to complete work tasks on computer inlcuding use of mouse with pain </= 3/10    Status  On-going      PT LONG TERM GOAL #4   Title  L ankle ROM WFL w/o increased pain to allow for normal gait pattern    Status  On-going      PT LONG TERM GOAL #5   Title  L ankle strength >/= 4/5 for improved gait stability    Status  On-going      PT LONG TERM GOAL #6   Title  Pt will ambulate with normal gait pattern w/o AD w/o evidence of L ankle instability    Status  On-going      PT LONG TERM GOAL #7   Title  Pt will return to full-time status at work w/o limitation due L ankle LOM, weakness or pain    Status  On-going            Plan - 04/25/18 0855    Clinical Impression Statement  [name] feels like ankle is progressing well, noting ability to wear tennis shoes and go for a 1 mile walk yesterday w/o limitations due to ankle. She requested today's session focus on R shoulder/upper arm as pain this morning up to 7/10, especially in impingement pattern positions such as when applying mascara. Significant increased muscle tension/TPs noted in upper and lateral shoulder as well as biceps which were addressed with manual therapy including DN with positive twitch response and palpable reduction in muscle tension by end of session. Treatment concluded with kinesiotaping for R shoudler impingement + deltoid inhibition to promote further muscle relaxation.     Rehab  Potential  Good    PT  Treatment/Interventions  Patient/family education;Manual techniques;Passive range of motion;Dry needling;Taping;Scar mobilization;Neuromuscular re-education;Therapeutic exercise;Therapeutic activities;Functional mobility training;Gait training;Stair training;Balance training;ADLs/Self Care Home Management;Ultrasound;Moist Heat;Electrical Stimulation;Cryotherapy;Vasopneumatic Device;Iontophoresis 1m/ml Dexamethasone    Consulted and Agree with Plan of Care  Patient       Patient will benefit from skilled therapeutic intervention in order to improve the following deficits and impairments:  Pain, Increased muscle spasms, Impaired flexibility, Hypomobility, Decreased range of motion, Decreased strength, Difficulty walking, Abnormal gait, Decreased activity tolerance, Decreased endurance, Decreased scar mobility, Postural dysfunction, Improper body mechanics  Visit Diagnosis: Stiffness of left ankle, not elsewhere classified  Pain in left ankle and joints of left foot  Difficulty in walking, not elsewhere classified  Other abnormalities of gait and mobility  Muscle weakness (generalized)  Chronic bilateral low back pain with bilateral sciatica  Pain of right upper arm  Cramp and spasm  Abnormal posture     Problem List Patient Active Problem List   Diagnosis Date Noted  . Closed displaced trimalleolar fracture of lower leg with routine healing 02/17/2018  . Lumbar stenosis with neurogenic claudication 01/07/2016  . Lumbar pseudoarthrosis 07/06/2014    JPercival Spanish PT, MPT 04/25/2018, 9:51 AM  [facility] [place] [address]Phone: [phone]  Fax:  [phone] Name: Angela PARTRIDGEMRN: [id number] of Birth: 712-15-1957

## 2018-04-26 ENCOUNTER — Encounter: Payer: Self-pay | Admitting: Physical Therapy

## 2018-04-26 ENCOUNTER — Ambulatory Visit: Payer: 59 | Admitting: Physical Therapy

## 2018-04-26 DIAGNOSIS — R252 Cramp and spasm: Secondary | ICD-10-CM

## 2018-04-26 DIAGNOSIS — M25672 Stiffness of left ankle, not elsewhere classified: Secondary | ICD-10-CM

## 2018-04-26 DIAGNOSIS — R293 Abnormal posture: Secondary | ICD-10-CM

## 2018-04-26 DIAGNOSIS — M6281 Muscle weakness (generalized): Secondary | ICD-10-CM

## 2018-04-26 DIAGNOSIS — M5442 Lumbago with sciatica, left side: Secondary | ICD-10-CM

## 2018-04-26 DIAGNOSIS — G8929 Other chronic pain: Secondary | ICD-10-CM

## 2018-04-26 DIAGNOSIS — M25572 Pain in left ankle and joints of left foot: Secondary | ICD-10-CM

## 2018-04-26 DIAGNOSIS — M5441 Lumbago with sciatica, right side: Secondary | ICD-10-CM

## 2018-04-26 DIAGNOSIS — R2689 Other abnormalities of gait and mobility: Secondary | ICD-10-CM

## 2018-04-26 DIAGNOSIS — R262 Difficulty in walking, not elsewhere classified: Secondary | ICD-10-CM

## 2018-04-26 DIAGNOSIS — M79621 Pain in right upper arm: Secondary | ICD-10-CM

## 2018-04-26 NOTE — Therapy (Signed)
[facility] [address]  [place], [place], [address] Phone: [phone]   Fax:  [phone]  Physical Therapy Treatment  Patient Details  Name: Angela Cook MRN: [id number] Date of Birth: 1955-11-15 Referring Provider (PT): Azucena Cecil, MD   Encounter Date: 04/26/2018  PT End of Session - 04/26/18 1400    Visit Number  18    Number of Visits  25    Date for PT Re-Evaluation  05/13/18    Authorization Type  Aet[employer] (no ionto)    PT Start Time  1400    PT Stop Time  1459    PT Time Calculation (min)  59 min    Activity Tolerance  Patient tolerated treatment well    Behavior During Therapy  [facility] for tasks assessed/performed       Past Medical History:  Diagnosis Date  . Arthritis   . Asthma    as a child, no issues since age 29  . Constipation due to opioid therapy   . Fibromyalgia    legs from back   . Pneumonia    as a child    Past Surgical History:  Procedure Laterality Date  . BACK SURGERY  07/05/13, 2015   lumbar 4-5, 2015- new bone graft inserted  . BUNIONECTOMY Left 2014  . CARPAL TUNNEL RELEASE Right 1986   left 1991  . CARPAL TUNNEL RELEASE Left   . CESAREAN SECTION  [phone]  . COLONOSCOPY    . LUMBAR LAMINECTOMY/DECOMPRESSION MICRODISCECTOMY Bilateral 01/07/2016   Procedure: DECOMPRESSIONL LUMBAR THREE-FOUR  WITH FORAMINOTOMIES ;  Surgeon: Kristeen Miss, MD;  Location: [facility];  Service: Neurosurgery;  Laterality: Bilateral;  . ORIF ANKLE FRACTURE Left 02/07/2018   Procedure: OPEN REDUCTION INTERNAL FIXATION (ORIF) LEFT BIMALLEOLAR ANKLE FRACTURE;  Surgeon: Leandrew Koyanagi, MD;  Location: [facility];  Service: Orthopedics;  Laterality: Left;  . SPINAL CORD STIMULATOR IMPLANT    . TUBAL LIGATION  1986    There were no vitals filed for this visit.  Subjective Assessment - 04/26/18 1402    Subjective  Pt reporting shoulder pain a littel better today and would like to pick back up with  ankle.    Pertinent History  L ankle trimalleolar ankle fracture s/p ORIF on 02/07/18; L4-5 MIS TLIF with iliac crest bone graft 07/05/2013; Revision of posterior interbody fusion 07/06/14; Bil laminotomies and foraminotomies L3-L4 with decompression of L3 and L4 nerve roots 01/07/16; Thoracic laminectomies with placement of implanted spinal stimulator 05/25/17    Patient Stated Goals  "reduce pain in R arm and buttocks" & "walk normally"    Currently in Pain?  Yes    Pain Score  5     Pain Location  Shoulder    Pain Orientation  Right    Pain Descriptors / Indicators  Aching    Multiple Pain Sites  No                       [facility] Adult PT Treatment/Exercise - 04/26/18 1400      Exercises   Exercises  Ankle      Modalities   Modalities  Vasopneumatic      Vasopneumatic   Number Minutes Vasopneumatic   15 minutes    Vasopnuematic Location   Ankle   L   Vasopneumatic Pressure  High    Vasopneumatic Temperature   coldest temp      Ankle Exercises: Aerobic  Nustep  L5 x 6 min (LE only)      Ankle Exercises: Standing   Vector Stance  Left    Vector Stance Limitations  L SLS on blue foam oval + 4 way clocks to balance pebbles x10; 1 pole A    Heel Walk (Round Trip)  2 x 77f; counter available for intermittent assist    Toe Walk (Round Trip)  2 x 957ftoe walk; 1 x 90 ft vaulting    Other Standing Ankle Exercises  Inverted BOSU - lateral wt shift x20, heel/toe wt shift x20, minisquat x10, L SLS 5 x 10 sec - B HHA from PT    Other Standing Ankle Exercises  R/L fwd lunge to BOSU x10 each; 1 pole A      Ankle Exercises: Seated   Other Seated Ankle Exercises  L 4 way ankle with green TB x10               PT Short Term Goals - 03/31/18 1130      PT SHORT TERM GOAL #1   Title  Independent with initial HEP    Status  Achieved      PT SHORT TERM GOAL #2   Title  Pt will verbalize/demonstrate understanding of appropriate posture and body mechanics needed for  daily activities including work station set-up    Baseline  Patient to demonstrate     Status  Achieved        PT Long Term Goals - 04/19/18 1432      PT LONG TERM GOAL #1   Title  Independent with ongoing/advanced HEP    Status  Partially Met      PT LONG TERM GOAL #2   Title  Pt will report ability to sit for >/= 30 minutes w/o limitation due to buttock pain to allow for increased focus at work and improved tolerance for driving    Status  Partially Met   only able to tolerated 30 min with driving      PT LONG TERM GOAL #3   Title  Pt will report ability to complete work tasks on computer inlcuding use of mouse with pain </= 3/10    Status  On-going      PT LONG TERM GOAL #4   Title  L ankle ROM WFL w/o increased pain to allow for normal gait pattern    Status  On-going      PT LONG TERM GOAL #5   Title  L ankle strength >/= 4/5 for improved gait stability    Status  On-going      PT LONG TERM GOAL #6   Title  Pt will ambulate with normal gait pattern w/o AD w/o evidence of L ankle instability    Status  On-going      PT LONG TERM GOAL #7   Title  Pt will return to full-time status at work w/o limitation due L ankle LOM, weakness or pain    Status  On-going            Plan - 04/26/18 1444    Clinical Impression Statement  LyTrystenontinues to report improving walking tolerance, working toward resuming walking 5 days/wk (plans to go for walk later today after PT). Treatment focusing on continued progression of L ankle strengthening, balance and proprioceptive training with progression of theraband resistance and increasingly unstable surfaces - good tolerance for increased theraband resistance to green TB, but she continues to demonstrate significant instability on  uneven/unstable surfaces requiring close guarding or HHA at time for balance indicating need for continued skilled PT to reduce the risk fo future fall or injury. Treatment concluded with vasopneumatic  compression to reduce swelling and post-exercise pain/discomfort as pt planning to go for a walk later today.    Rehab Potential  Good    PT Treatment/Interventions  Patient/family education;Manual techniques;Passive range of motion;Dry needling;Taping;Scar mobilization;Neuromuscular re-education;Therapeutic exercise;Therapeutic activities;Functional mobility training;Gait training;Stair training;Balance training;ADLs/Self Care Home Management;Ultrasound;Moist Heat;Electrical Stimulation;Cryotherapy;Vasopneumatic Device;Iontophoresis 9m/ml Dexamethasone    Consulted and Agree with Plan of Care  Patient       Patient will benefit from skilled therapeutic intervention in order to improve the following deficits and impairments:  Pain, Increased muscle spasms, Impaired flexibility, Hypomobility, Decreased range of motion, Decreased strength, Difficulty walking, Abnormal gait, Decreased activity tolerance, Decreased endurance, Decreased scar mobility, Postural dysfunction, Improper body mechanics  Visit Diagnosis: Stiffness of left ankle, not elsewhere classified  Pain in left ankle and joints of left foot  Difficulty in walking, not elsewhere classified  Other abnormalities of gait and mobility  Muscle weakness (generalized)  Chronic bilateral low back pain with bilateral sciatica  Pain of right upper arm  Cramp and spasm  Abnormal posture     Problem List Patient Active Problem List   Diagnosis Date Noted  . Closed displaced trimalleolar fracture of lower leg with routine healing 02/17/2018  . Lumbar stenosis with neurogenic claudication 01/07/2016  . Lumbar pseudoarthrosis 07/06/2014    JPercival Spanish PT, MPT 04/26/2018, 4:47 PM  [facility] [place] [address]Phone: [phone]  Fax:  [phone] Name: Angela HARSHAMRN: [id number] of Birth: [birth year]

## 2018-04-28 ENCOUNTER — Ambulatory Visit: Payer: 59 | Admitting: Physical Therapy

## 2018-04-28 ENCOUNTER — Encounter: Payer: Self-pay | Admitting: Physical Therapy

## 2018-04-28 DIAGNOSIS — M25672 Stiffness of left ankle, not elsewhere classified: Secondary | ICD-10-CM | POA: Diagnosis not present

## 2018-04-28 DIAGNOSIS — M5441 Lumbago with sciatica, right side: Secondary | ICD-10-CM

## 2018-04-28 DIAGNOSIS — R262 Difficulty in walking, not elsewhere classified: Secondary | ICD-10-CM

## 2018-04-28 DIAGNOSIS — R252 Cramp and spasm: Secondary | ICD-10-CM

## 2018-04-28 DIAGNOSIS — R293 Abnormal posture: Secondary | ICD-10-CM

## 2018-04-28 DIAGNOSIS — G8929 Other chronic pain: Secondary | ICD-10-CM

## 2018-04-28 DIAGNOSIS — M6281 Muscle weakness (generalized): Secondary | ICD-10-CM

## 2018-04-28 DIAGNOSIS — M79621 Pain in right upper arm: Secondary | ICD-10-CM

## 2018-04-28 DIAGNOSIS — M5442 Lumbago with sciatica, left side: Secondary | ICD-10-CM

## 2018-04-28 DIAGNOSIS — R2689 Other abnormalities of gait and mobility: Secondary | ICD-10-CM

## 2018-04-28 DIAGNOSIS — M25572 Pain in left ankle and joints of left foot: Secondary | ICD-10-CM

## 2018-04-28 NOTE — Therapy (Signed)
[facility] [address]  [place], [place], [address] Phone: [phone]   Fax:  [phone]  Physical Therapy Treatment  Patient Details  Name: Angela Cook MRN: [id number] Date of Birth: 12-17-1955 Referring Provider (PT): Azucena Cecil, MD   Encounter Date: 04/28/2018  PT End of Session - 04/28/18 0934    Visit Number  19    Number of Visits  25    Date for PT Re-Evaluation  05/13/18    Authorization Type  Aetna (no ionto)    PT Start Time  [id number]    PT Stop Time  1028    PT Time Calculation (min)  54 min    Activity Tolerance  Patient tolerated treatment well    Behavior During Therapy  [facility] for tasks assessed/performed       Past Medical History:  Diagnosis Date  . Arthritis   . Asthma    as a child, no issues since age 22  . Constipation due to opioid therapy   . Fibromyalgia    legs from back   . Pneumonia    as a child    Past Surgical History:  Procedure Laterality Date  . BACK SURGERY  07/05/13, 2015   lumbar 4-5, 2015- new bone graft inserted  . BUNIONECTOMY Left 2014  . CARPAL TUNNEL RELEASE Right 1986   left 1991  . CARPAL TUNNEL RELEASE Left   . CESAREAN SECTION  [phone]  . COLONOSCOPY    . LUMBAR LAMINECTOMY/DECOMPRESSION MICRODISCECTOMY Bilateral 01/07/2016   Procedure: DECOMPRESSIONL LUMBAR THREE-FOUR  WITH FORAMINOTOMIES ;  Surgeon: Kristeen Miss, MD;  Location: [facility];  Service: Neurosurgery;  Laterality: Bilateral;  . ORIF ANKLE FRACTURE Left 02/07/2018   Procedure: OPEN REDUCTION INTERNAL FIXATION (ORIF) LEFT BIMALLEOLAR ANKLE FRACTURE;  Surgeon: Leandrew Koyanagi, MD;  Location: [facility];  Service: Orthopedics;  Laterality: Left;  . SPINAL CORD STIMULATOR IMPLANT    . TUBAL LIGATION  1986    There were no vitals filed for this visit.  Subjective Assessment - 04/28/18 0937    Subjective  Pt reporting she has already walked 1.5 miles today w/o any issues.    Pertinent  History  L ankle trimalleolar ankle fracture s/p ORIF on 02/07/18; L4-5 MIS TLIF with iliac crest bone graft 07/05/2013; Revision of posterior interbody fusion 07/06/14; Bil laminotomies and foraminotomies L3-L4 with decompression of L3 and L4 nerve roots 01/07/16; Thoracic laminectomies with placement of implanted spinal stimulator 05/25/17    Patient Stated Goals  "reduce pain in R arm and buttocks" & "walk normally"    Currently in Pain?  No/denies                       [facility] Adult PT Treatment/Exercise - 04/28/18 0934      Exercises   Exercises  Ankle      Ankle Exercises: Aerobic   Nustep  L5 x 6 min (LE only)      Ankle Exercises: Standing   SLS  L SLS + B pallof press with red TB x10    Rocker Board Limitations  static balance with wide BOS, narrow BOS & L SLS x ~30 sec each    Heel Raises  Both;15 reps;3 seconds    Heel Raises Limitations  triple extension squat with TRX cables    Balance Beam  Foam balance beam - B sidestepping - feet centered on beam x 4, side-stepping with cone  knock down & righting 4 cones x 2; heels on beam x 2, toes on beam x 2; Tandem gait - fwd x 4, back x 2 - intermittent HHA; R/L lunges x 5, 1 UE support on back of chair    Warrior III  L SLS with reach for mat table x 10    Other Standing Ankle Exercises  B side-stepping with red TB at forefoot 2 x 69f               PT Short Term Goals - 03/31/18 1130      PT SHORT TERM GOAL #1   Title  Independent with initial HEP    Status  Achieved      PT SHORT TERM GOAL #2   Title  Pt will verbalize/demonstrate understanding of appropriate posture and body mechanics needed for daily activities including work station set-up    Baseline  Patient to demonstrate     Status  Achieved        PT Long Term Goals - 04/28/18 1028      PT LONG TERM GOAL #1   Title  Independent with ongoing/advanced HEP    Status  Partially Met      PT LONG TERM GOAL #2   Title  Pt will report ability to  sit for >/= 30 minutes w/o limitation due to buttock pain to allow for increased focus at work and improved tolerance for driving    Status  Partially Met      PT LONG TERM GOAL #3   Title  Pt will report ability to complete work tasks on computer inlcuding use of mouse with pain </= 3/10    Status  Achieved      PT LONG TERM GOAL #4   Title  L ankle ROM WFL w/o increased pain to allow for normal gait pattern    Status  On-going      PT LONG TERM GOAL #5   Title  L ankle strength >/= 4/5 for improved gait stability    Status  On-going      PT LONG TERM GOAL #6   Title  Pt will ambulate with normal gait pattern w/o AD w/o evidence of L ankle instability    Status  Partially Met      PT LONG TERM GOAL #7   Title  Pt will return to full-time status at work w/o limitation due L ankle LOM, weakness or pain    Status  On-going            Plan - 04/28/18 0940    Clinical Impression Statement  Treatment continues to focus on L ankle strengthening, proprioceptive training and balance to restore functional ROM and strength while decreasing risk for future falls - pt continues to struggle with SLS stability as well as static and dynamic balance on unstable or compliant surfaces but has demonstrated decreasing reliance on external support. Pt will f/u with ankle surgeon the end of next week.    Rehab Potential  Good    PT Treatment/Interventions  Patient/family education;Manual techniques;Passive range of motion;Dry needling;Taping;Scar mobilization;Neuromuscular re-education;Therapeutic exercise;Therapeutic activities;Functional mobility training;Gait training;Stair training;Balance training;ADLs/Self Care Home Management;Ultrasound;Moist Heat;Electrical Stimulation;Cryotherapy;Vasopneumatic Device;Iontophoresis 474mml Dexamethasone    Consulted and Agree with Plan of Care  Patient       Patient will benefit from skilled therapeutic intervention in order to improve the following deficits  and impairments:  Pain, Increased muscle spasms, Impaired flexibility, Hypomobility, Decreased range of motion, Decreased strength, Difficulty  walking, Abnormal gait, Decreased activity tolerance, Decreased endurance, Decreased scar mobility, Postural dysfunction, Improper body mechanics  Visit Diagnosis: Stiffness of left ankle, not elsewhere classified  Pain in left ankle and joints of left foot  Difficulty in walking, not elsewhere classified  Other abnormalities of gait and mobility  Muscle weakness (generalized)  Chronic bilateral low back pain with bilateral sciatica  Pain of right upper arm  Cramp and spasm  Abnormal posture     Problem List Patient Active Problem List   Diagnosis Date Noted  . Closed displaced trimalleolar fracture of lower leg with routine healing 02/17/2018  . Lumbar stenosis with neurogenic claudication 01/07/2016  . Lumbar pseudoarthrosis 07/06/2014    Percival Spanish, PT, MPT 04/28/2018, 12:30 PM  [facility] [address]  [place], [place], [address] Phone: [phone]   Fax:  [phone]  Name: Angela Cook MRN: [id number] Date of Birth: [birth year]

## 2018-05-03 ENCOUNTER — Ambulatory Visit: Payer: 59

## 2018-05-03 DIAGNOSIS — M5441 Lumbago with sciatica, right side: Secondary | ICD-10-CM

## 2018-05-03 DIAGNOSIS — M79621 Pain in right upper arm: Secondary | ICD-10-CM

## 2018-05-03 DIAGNOSIS — M5442 Lumbago with sciatica, left side: Secondary | ICD-10-CM

## 2018-05-03 DIAGNOSIS — M25672 Stiffness of left ankle, not elsewhere classified: Secondary | ICD-10-CM | POA: Diagnosis not present

## 2018-05-03 DIAGNOSIS — R293 Abnormal posture: Secondary | ICD-10-CM

## 2018-05-03 DIAGNOSIS — R2689 Other abnormalities of gait and mobility: Secondary | ICD-10-CM

## 2018-05-03 DIAGNOSIS — M6281 Muscle weakness (generalized): Secondary | ICD-10-CM

## 2018-05-03 DIAGNOSIS — R252 Cramp and spasm: Secondary | ICD-10-CM

## 2018-05-03 DIAGNOSIS — G8929 Other chronic pain: Secondary | ICD-10-CM

## 2018-05-03 DIAGNOSIS — R262 Difficulty in walking, not elsewhere classified: Secondary | ICD-10-CM

## 2018-05-03 DIAGNOSIS — M25572 Pain in left ankle and joints of left foot: Secondary | ICD-10-CM

## 2018-05-03 NOTE — Therapy (Signed)
[facility] [address]  [place], [place], [address] Phone: [phone]   Fax:  [phone]  Physical Therapy Treatment  Patient Details  Name: Angela Cook MRN: [id number] Date of Birth: [birth year] Referring Provider (PT): Azucena Cecil, MD   Encounter Date: 05/03/2018  PT End of Session - 05/03/18 1406    Visit Number  20    Number of Visits  25    Date for PT Re-Evaluation  05/13/18    Authorization Type  Aetna (no ionto)    PT Start Time  1401    PT Stop Time  1443    PT Time Calculation (min)  42 min    Activity Tolerance  Patient tolerated treatment well    Behavior During Therapy  [facility] for tasks assessed/performed       Past Medical History:  Diagnosis Date  . Arthritis   . Asthma    as a child, no issues since age 28  . Constipation due to opioid therapy   . Fibromyalgia    legs from back   . Pneumonia    as a child    Past Surgical History:  Procedure Laterality Date  . BACK SURGERY  07/05/13, 2015   lumbar 4-5, 2015- new bone graft inserted  . BUNIONECTOMY Left 2014  . CARPAL TUNNEL RELEASE Right 1986   left 1991  . CARPAL TUNNEL RELEASE Left   . CESAREAN SECTION  [phone]  . COLONOSCOPY    . LUMBAR LAMINECTOMY/DECOMPRESSION MICRODISCECTOMY Bilateral 01/07/2016   Procedure: DECOMPRESSIONL LUMBAR THREE-FOUR  WITH FORAMINOTOMIES ;  Surgeon: Kristeen Miss, MD;  Location: [facility];  Service: Neurosurgery;  Laterality: Bilateral;  . ORIF ANKLE FRACTURE Left 02/07/2018   Procedure: OPEN REDUCTION INTERNAL FIXATION (ORIF) LEFT BIMALLEOLAR ANKLE FRACTURE;  Surgeon: Leandrew Koyanagi, MD;  Location: [facility];  Service: Orthopedics;  Laterality: Left;  . SPINAL CORD STIMULATOR IMPLANT    . TUBAL LIGATION  1986    There were no vitals filed for this visit.  Subjective Assessment - 05/03/18 1405    Subjective  Noting she plans to finish up with therapy over next few visits.      Pertinent  History  L ankle trimalleolar ankle fracture s/p ORIF on 02/07/18; L4-5 MIS TLIF with iliac crest bone graft 07/05/2013; Revision of posterior interbody fusion 07/06/14; Bil laminotomies and foraminotomies L3-L4 with decompression of L3 and L4 nerve roots 01/07/16; Thoracic laminectomies with placement of implanted spinal stimulator 05/25/17    Patient Stated Goals  "reduce pain in R arm and buttocks" & "walk normally"    Currently in Pain?  No/denies    Pain Score  0-No pain   up to 7/10 pain at worst with raising arm to side    Pain Location  Shoulder    Pain Orientation  Right    Pain Descriptors / Indicators  Aching    Pain Type  Chronic pain    Pain Frequency  Intermittent    Aggravating Factors   raising arm     Pain Relieving Factors  rest     Multiple Pain Sites  No         [facility] PT Assessment - 05/03/18 1412      AROM   AROM Assessment Site  Ankle    Right/Left Ankle  Right;Left    Right Ankle Dorsiflexion  9    Right Ankle Plantar Flexion  58    Right Ankle Inversion  31    Right Ankle Eversion  20    Left Ankle Dorsiflexion  10    Left Ankle Plantar Flexion  54    Left Ankle Inversion  21    Left Ankle Eversion  14      Strength   Right/Left Hip  Right;Left    Right Hip Flexion  4+/5    Right Hip Extension  4/5    Right Hip External Rotation   4/5    Right Hip Internal Rotation  4+/5    Right Hip ABduction  4+/5    Right Hip ADduction  4+/5    Left Hip Flexion  4+/5    Left Hip Extension  4/5    Left Hip External Rotation  4+/5    Left Hip Internal Rotation  4+/5    Left Hip ABduction  4+/5    Left Hip ADduction  4/5    Right/Left Knee  Right;Left    Right Knee Flexion  5/5    Right Knee Extension  5/5    Left Knee Flexion  5/5    Left Knee Extension  4/5    Right/Left Ankle  Right;Left    Right Ankle Dorsiflexion  4+/5    Right Ankle Plantar Flexion  4+/5    Right Ankle Inversion  4+/5    Right Ankle Eversion  4+/5    Left Ankle Dorsiflexion  4+/5     Left Ankle Plantar Flexion  4+/5    Left Ankle Inversion  4+/5    Left Ankle Eversion  4+/5                   [facility] Adult PT Treatment/Exercise - 05/03/18 1817      Ambulation/Gait   Ambulation/Gait  Yes    Ambulation/Gait Assistance  7: Independent    Ambulation Distance (Feet)  180 Feet    Gait Comments  Only mild cueing for B heel strike provided initially then pt. able to demo "normal" gait pattern without cueing      Ankle Exercises: Aerobic   Nustep  L5 x 6 min (LE only)      Ankle Exercises: Stretches   Gastroc Stretch  2 reps;60 seconds    Gastroc Stretch Limitations  leaning on wall                PT Short Term Goals - 03/31/18 1130      PT SHORT TERM GOAL #1   Title  Independent with initial HEP    Status  Achieved      PT SHORT TERM GOAL #2   Title  Pt will verbalize/demonstrate understanding of appropriate posture and body mechanics needed for daily activities including work station set-up    Baseline  Patient to demonstrate     Status  Achieved        PT Long Term Goals - 05/03/18 1408      PT LONG TERM GOAL #1   Title  Independent with ongoing/advanced HEP    Status  Partially Met      PT LONG TERM GOAL #2   Title  Pt will report ability to sit for >/= 30 minutes w/o limitation due to buttock pain to allow for increased focus at work and improved tolerance for driving    Status  Achieved      PT LONG TERM GOAL #3   Title  Pt will report ability to complete work tasks on computer inlcuding use of mouse with pain </=  3/10    Status  Achieved      PT LONG TERM GOAL #4   Title  L ankle ROM WFL w/o increased pain to allow for normal gait pattern    Status  Achieved      PT LONG TERM GOAL #5   Title  L ankle strength >/= 4/5 for improved gait stability    Status  Achieved      PT LONG TERM GOAL #6   Title  Pt will ambulate with normal gait pattern w/o AD w/o evidence of L ankle instability    Status  Achieved      PT LONG TERM  GOAL #7   Title  Pt will return to full-time status at work w/o limitation due L ankle LOM, weakness or pain    Status  On-going            Plan - 05/03/18 1410    Clinical Impression Statement  Angela Cook has made good progress with therapy.  Able to achieve or partially achieve most therapy goals with exception of not yet returning to work full time thus unable to address with LTG.  Pt. verbalizing that she feels ready to finish up with therapy over next few visits.  Will plan to focus on HEP review and update in upcoming visits.      PT Treatment/Interventions  Patient/family education;Manual techniques;Passive range of motion;Dry needling;Taping;Scar mobilization;Neuromuscular re-education;Therapeutic exercise;Therapeutic activities;Functional mobility training;Gait training;Stair training;Balance training;ADLs/Self Care Home Management;Ultrasound;Moist Heat;Electrical Stimulation;Cryotherapy;Vasopneumatic Device;Iontophoresis 82m/ml Dexamethasone    Consulted and Agree with Plan of Care  Patient       Patient will benefit from skilled therapeutic intervention in order to improve the following deficits and impairments:  Pain, Increased muscle spasms, Impaired flexibility, Hypomobility, Decreased range of motion, Decreased strength, Difficulty walking, Abnormal gait, Decreased activity tolerance, Decreased endurance, Decreased scar mobility, Postural dysfunction, Improper body mechanics  Visit Diagnosis: Stiffness of left ankle, not elsewhere classified  Pain in left ankle and joints of left foot  Difficulty in walking, not elsewhere classified  Other abnormalities of gait and mobility  Muscle weakness (generalized)  Chronic bilateral low back pain with bilateral sciatica  Pain of right upper arm  Cramp and spasm  Abnormal posture     Problem List Patient Active Problem List   Diagnosis Date Noted  . Closed displaced trimalleolar fracture of lower leg with routine healing  02/17/2018  . Lumbar stenosis with neurogenic claudication 01/07/2016  . Lumbar pseudoarthrosis 07/06/2014    Angela Cook PTA 05/03/18 6:21 PM   [facility] [address] [place] [address]Phone: [phone]  Fax:  [phone] Name: LTOKIKO DIEFENDERFERMRN: [id number] of Birth: 7Dec 13, 1957

## 2018-05-05 ENCOUNTER — Ambulatory Visit: Payer: 59

## 2018-05-05 DIAGNOSIS — M25572 Pain in left ankle and joints of left foot: Secondary | ICD-10-CM

## 2018-05-05 DIAGNOSIS — R252 Cramp and spasm: Secondary | ICD-10-CM

## 2018-05-05 DIAGNOSIS — M5441 Lumbago with sciatica, right side: Secondary | ICD-10-CM

## 2018-05-05 DIAGNOSIS — M25672 Stiffness of left ankle, not elsewhere classified: Secondary | ICD-10-CM | POA: Diagnosis not present

## 2018-05-05 DIAGNOSIS — R293 Abnormal posture: Secondary | ICD-10-CM

## 2018-05-05 DIAGNOSIS — M79621 Pain in right upper arm: Secondary | ICD-10-CM

## 2018-05-05 DIAGNOSIS — M6281 Muscle weakness (generalized): Secondary | ICD-10-CM

## 2018-05-05 DIAGNOSIS — G8929 Other chronic pain: Secondary | ICD-10-CM

## 2018-05-05 DIAGNOSIS — M5442 Lumbago with sciatica, left side: Secondary | ICD-10-CM

## 2018-05-05 DIAGNOSIS — R2689 Other abnormalities of gait and mobility: Secondary | ICD-10-CM

## 2018-05-05 DIAGNOSIS — R262 Difficulty in walking, not elsewhere classified: Secondary | ICD-10-CM

## 2018-05-05 NOTE — Therapy (Signed)
[facility] [address]  [place], [place], [address] Phone: [phone]   Fax:  [phone]  Physical Therapy Treatment  Patient Details  Name: Angela Cook MRN: [id number] Date of Birth: Mar 28, 1956 Referring Provider (PT): Azucena Cecil, MD   Encounter Date: 05/05/2018  PT End of Session - 05/05/18 0938    Visit Number  21    Number of Visits  25    Date for PT Re-Evaluation  05/13/18    Authorization Type  Aetna (no ionto)    PT Start Time  [phone]    PT Stop Time  1013    PT Time Calculation (min)  39 min    Activity Tolerance  Patient tolerated treatment well    Behavior During Therapy  [facility] for tasks assessed/performed       Past Medical History:  Diagnosis Date  . Arthritis   . Asthma    as a child, no issues since age 16  . Constipation due to opioid therapy   . Fibromyalgia    legs from back   . Pneumonia    as a child    Past Surgical History:  Procedure Laterality Date  . BACK SURGERY  07/05/13, 2015   lumbar 4-5, 2015- new bone graft inserted  . BUNIONECTOMY Left 2014  . CARPAL TUNNEL RELEASE Right 1986   left 1991  . CARPAL TUNNEL RELEASE Left   . CESAREAN SECTION  [phone]  . COLONOSCOPY    . LUMBAR LAMINECTOMY/DECOMPRESSION MICRODISCECTOMY Bilateral 01/07/2016   Procedure: DECOMPRESSIONL LUMBAR THREE-FOUR  WITH FORAMINOTOMIES ;  Surgeon: Kristeen Miss, MD;  Location: [place] [facility];  Service: Neurosurgery;  Laterality: Bilateral;  . ORIF ANKLE FRACTURE Left 02/07/2018   Procedure: OPEN REDUCTION INTERNAL FIXATION (ORIF) LEFT BIMALLEOLAR ANKLE FRACTURE;  Surgeon: Leandrew Koyanagi, MD;  Location: [place];  Service: Orthopedics;  Laterality: Left;  . SPINAL CORD STIMULATOR IMPLANT    . TUBAL LIGATION  1986    There were no vitals filed for this visit.  Subjective Assessment - 05/05/18 0938    Subjective  Pt. reporting she is ready for working full time next week.      Pertinent History  L  ankle trimalleolar ankle fracture s/p ORIF on 02/07/18; L4-5 MIS TLIF with iliac crest bone graft 07/05/2013; Revision of posterior interbody fusion 07/06/14; Bil laminotomies and foraminotomies L3-L4 with decompression of L3 and L4 nerve roots 01/07/16; Thoracic laminectomies with placement of implanted spinal stimulator 05/25/17    Patient Stated Goals  "reduce pain in R arm and buttocks" & "walk normally"    Currently in Pain?  No/denies    Multiple Pain Sites  No                       [facility] Adult PT Treatment/Exercise - 05/05/18 1005      Self-Care   Self-Care  Other Self-Care Comments    Other Self-Care Comments   Final review and update of home program with update of green TB for row and green looped TB for side stepping       Lumbar Exercises: Sidelying   Clam  Right;Left;10 reps;3 seconds    Clam Limitations  Green TB at knees       Ankle Exercises: Standing   Heel Raises  Both;15 reps;3 seconds    Heel Raises Limitations  B con/L ecc     Other Standing Ankle Exercises  L ankle "clocks"  with R LE (3,6,9,12) 2 x 30 sec; both sets on foam    NO UE SUPPORT     Ankle Exercises: Aerobic   Recumbent Bike  L2 x 7 min             PT Education - 05/05/18 1152    Education Details  HEP update; green looped TB issued to pt. for side stepping, green TB issued to pt. for band row    Person(s) Educated  Patient    Methods  Explanation;Demonstration;Verbal cues;Handout    Comprehension  Verbalized understanding;Verbal cues required;Need further instruction       PT Short Term Goals - 03/31/18 1130      PT SHORT TERM GOAL #1   Title  Independent with initial HEP    Status  Achieved      PT SHORT TERM GOAL #2   Title  Pt will verbalize/demonstrate understanding of appropriate posture and body mechanics needed for daily activities including work station set-up    Baseline  Patient to demonstrate     Status  Achieved        PT Long Term Goals - 05/03/18 1408       PT LONG TERM GOAL #1   Title  Independent with ongoing/advanced HEP    Status  Partially Met      PT LONG TERM GOAL #2   Title  Pt will report ability to sit for >/= 30 minutes w/o limitation due to buttock pain to allow for increased focus at work and improved tolerance for driving    Status  Achieved      PT LONG TERM GOAL #3   Title  Pt will report ability to complete work tasks on computer inlcuding use of mouse with pain </= 3/10    Status  Achieved      PT LONG TERM GOAL #4   Title  L ankle ROM WFL w/o increased pain to allow for normal gait pattern    Status  Achieved      PT LONG TERM GOAL #5   Title  L ankle strength >/= 4/5 for improved gait stability    Status  Achieved      PT LONG TERM GOAL #6   Title  Pt will ambulate with normal gait pattern w/o AD w/o evidence of L ankle instability    Status  Achieved      PT LONG TERM GOAL #7   Title  Pt will return to full-time status at work w/o limitation due L ankle LOM, weakness or pain    Status  On-going            Plan - 05/05/18 0940    Clinical Impression Statement  Session focusing on HEP review and update of band resistance in preparation for pt. likely transitioning to home. Pt. and supervising PT approving plan to transition to home program upon next visit with final goal testing and HEP review.  Pt. with plans to request to return to full time work with upcoming MD f/u.  Will continue to progress toward goals.      PT Treatment/Interventions  Patient/family education;Manual techniques;Passive range of motion;Dry needling;Taping;Scar mobilization;Neuromuscular re-education;Therapeutic exercise;Therapeutic activities;Functional mobility training;Gait training;Stair training;Balance training;ADLs/Self Care Home Management;Ultrasound;Moist Heat;Electrical Stimulation;Cryotherapy;Vasopneumatic Device;Iontophoresis 74m/ml Dexamethasone    Consulted and Agree with Plan of Care  Patient       Patient will  benefit from skilled therapeutic intervention in order to improve the following deficits and impairments:  Pain, Increased muscle  spasms, Impaired flexibility, Hypomobility, Decreased range of motion, Decreased strength, Difficulty walking, Abnormal gait, Decreased activity tolerance, Decreased endurance, Decreased scar mobility, Postural dysfunction, Improper body mechanics  Visit Diagnosis: Stiffness of left ankle, not elsewhere classified  Pain in left ankle and joints of left foot  Difficulty in walking, not elsewhere classified  Other abnormalities of gait and mobility  Muscle weakness (generalized)  Chronic bilateral low back pain with bilateral sciatica  Pain of right upper arm  Cramp and spasm  Abnormal posture     Problem List Patient Active Problem List   Diagnosis Date Noted  . Closed displaced trimalleolar fracture of lower leg with routine healing 02/17/2018  . Lumbar stenosis with neurogenic claudication 01/07/2016  . Lumbar pseudoarthrosis 07/06/2014    Bess Harvest, PTA 05/05/18 12:23 PM   [facility] [address]  [place], [place], [address] Phone: [phone]   Fax:  [phone]  Name: Angela Cook MRN: [id number] Date of Birth: [birth year]

## 2018-05-06 ENCOUNTER — Encounter (INDEPENDENT_AMBULATORY_CARE_PROVIDER_SITE_OTHER): Payer: Self-pay | Admitting: Orthopaedic Surgery

## 2018-05-06 ENCOUNTER — Ambulatory Visit (INDEPENDENT_AMBULATORY_CARE_PROVIDER_SITE_OTHER): Payer: 59

## 2018-05-06 ENCOUNTER — Ambulatory Visit (INDEPENDENT_AMBULATORY_CARE_PROVIDER_SITE_OTHER): Payer: 59 | Admitting: Orthopaedic Surgery

## 2018-05-06 ENCOUNTER — Encounter (INDEPENDENT_AMBULATORY_CARE_PROVIDER_SITE_OTHER): Payer: Self-pay

## 2018-05-06 DIAGNOSIS — M25511 Pain in right shoulder: Secondary | ICD-10-CM

## 2018-05-06 DIAGNOSIS — S82852D Displaced trimalleolar fracture of left lower leg, subsequent encounter for closed fracture with routine healing: Secondary | ICD-10-CM | POA: Diagnosis not present

## 2018-05-06 DIAGNOSIS — G8929 Other chronic pain: Secondary | ICD-10-CM

## 2018-05-06 MED ORDER — METHYLPREDNISOLONE ACETATE 40 MG/ML IJ SUSP
40.0000 mg | INTRAMUSCULAR | Status: AC | PRN
  Administered 2018-05-06: 40 mg via INTRA_ARTICULAR

## 2018-05-06 MED ORDER — LIDOCAINE HCL 2 % IJ SOLN
2.0000 mL | INTRAMUSCULAR | Status: AC | PRN
  Administered 2018-05-06: 2 mL

## 2018-05-06 MED ORDER — BUPIVACAINE HCL 0.25 % IJ SOLN
2.0000 mL | INTRAMUSCULAR | Status: AC | PRN
  Administered 2018-05-06: 2 mL via INTRA_ARTICULAR

## 2018-05-06 NOTE — Telephone Encounter (Signed)
Yes let's do that for her

## 2018-05-06 NOTE — Progress Notes (Signed)
Office Visit Note   Patient: Angela Cook           Date of Birth: 07/26/1955           MRN: [id number] Visit Date: 05/06/2018              Requested by: Deloris Ping, MD [address] [place], [place] [address] PCP: Deloris Ping, MD   Assessment & Plan: Visit Diagnoses:  1. Closed displaced trimalleolar fracture of left ankle with routine healing, subsequent encounter   2. Chronic right shoulder pain     Plan: Impression is #1 status post left ankle ORIF bimalleolar fracture doing well.  #2 right shoulder rotator cuff tendinitis and subacromial bursitis.  In regards to the right shoulder, we will inject this with cortisone today.  She will have her physical therapist lay out a program for her to do at home in regards to the right shoulder exercises, stretching.  She will follow-up with [place] in 4 weeks time for final x-ray of her left ankle and at that point if she has noticed no improvement of pain in the right shoulder, will obtain an MRI.  She would like to obtain an MRI sooner, she will wait at least 2 weeks following the injection and then let us know.  Follow-Up Instructions: Return in about 4 weeks (around 06/03/2018).   Orders:  Orders Placed This Encounter  Procedures  . Large Joint Inj: R subacromial bursa  . XR Ankle Complete Left  . XR Shoulder Right   No orders of the defined types were placed in this encounter.     Procedures: Large Joint Inj: R subacromial bursa on 05/06/2018 10:40 AM Indications: pain Details: 22 G needle Medications: 2 mL lidocaine 2 %; 2 mL bupivacaine 0.25 %; 40 mg methylPREDNISolone acetate 40 MG/ML Outcome: tolerated well, no immediate complications Patient was prepped and draped in the usual sterile fashion.       Clinical Data: No additional findings.   Subjective: Chief Complaint  Patient presents with  . Left Ankle - Follow-up    HPI patient is a pleasant 62 year old female who presents  to our clinic today 88 days status post ORIF left bimalleolar ankle fracture, date of surgery 02/07/2018.  She has been in outpatient physical therapy regaining motion.  She has one visit left.  Very minimal to no pain to the left ankle.  The other issue she brings up today is her right shoulder.  Several months of pain radiating into the deltoid.  No injury or change in activity.  The pain she has is to the top of her shoulder and radiates into the deltoid.  Worse with overhead activities as well as when she is trying to put on her make-up and sleeping on the right side.  She has been in physical therapy for this and has not noticed any improvement.  She has also taken Aleve without improvement of symptoms.  No numbness, tingling or burning.  No previous cortisone injection.  Review of Systems as detailed HPI.  All others reviewed and are negative.   Objective: Vital Signs: There were no vitals taken for this visit.  Physical Exam follow-up well-nourished female no acute distress.  Alert and oriented x3.  Ortho Exam examination of the left ankle reveals well-healed surgical incisions without evidence of infection or cellulitis.  Painless range of motion of the ankle.  She can dorsiflex to about 10 degrees.  Calf is soft nontender.  Examination  of the right shoulder reveals full active range of motion.  She does have pain with the extremes of forward flexion and internal rotation.  She can internally rotate to L5.  Markedly positive empty can and cross body adduction.  Negative drop arm.  Specialty Comments:  No specialty comments available.  Imaging: Xr Ankle Complete Left  Result Date: 05/06/2018 Well-placed hardware with significant healing of the fractures  Xr Shoulder Right  Result Date: 05/06/2018 Moderate AC degenerative changes.  No glenohumeral space narrowing    PMFS History: Patient Active Problem List   Diagnosis Date Noted  . Chronic right shoulder pain 05/06/2018  .  Closed displaced trimalleolar fracture of lower leg with routine healing 02/17/2018  . Lumbar stenosis with neurogenic claudication 01/07/2016  . Lumbar pseudoarthrosis 07/06/2014   Past Medical History:  Diagnosis Date  . Arthritis   . Asthma    as a child, no issues since age 32  . Constipation due to opioid therapy   . Fibromyalgia    legs from back   . Pneumonia    as a child    History reviewed. No pertinent family history.  Past Surgical History:  Procedure Laterality Date  . BACK SURGERY  07/05/13, 2015   lumbar 4-5, 2015- new bone graft inserted  . BUNIONECTOMY Left 2014  . CARPAL TUNNEL RELEASE Right 1986   left 1991  . CARPAL TUNNEL RELEASE Left   . CESAREAN SECTION  [phone]  . COLONOSCOPY    . LUMBAR LAMINECTOMY/DECOMPRESSION MICRODISCECTOMY Bilateral 01/07/2016   Procedure: DECOMPRESSIONL LUMBAR THREE-FOUR  WITH FORAMINOTOMIES ;  Surgeon: Barnett Abu, MD;  Location: [facility];  Service: Neurosurgery;  Laterality: Bilateral;  . ORIF ANKLE FRACTURE Left 02/07/2018   Procedure: OPEN REDUCTION INTERNAL FIXATION (ORIF) LEFT BIMALLEOLAR ANKLE FRACTURE;  Surgeon: Tarry Kos, MD;  Location: [facility];  Service: Orthopedics;  Laterality: Left;  . SPINAL CORD STIMULATOR IMPLANT    . TUBAL LIGATION  1986   Social History   Occupational History  . Not on file  Tobacco Use  . Smoking status: Never Smoker  . Smokeless tobacco: Never Used  Substance and Sexual Activity  . Alcohol use: No    Frequency: Never  . Drug use: No  . Sexual activity: Not on file

## 2018-05-10 ENCOUNTER — Ambulatory Visit: Payer: 59

## 2018-05-10 DIAGNOSIS — M79621 Pain in right upper arm: Secondary | ICD-10-CM

## 2018-05-10 DIAGNOSIS — R293 Abnormal posture: Secondary | ICD-10-CM

## 2018-05-10 DIAGNOSIS — R252 Cramp and spasm: Secondary | ICD-10-CM

## 2018-05-10 DIAGNOSIS — M6281 Muscle weakness (generalized): Secondary | ICD-10-CM

## 2018-05-10 DIAGNOSIS — R262 Difficulty in walking, not elsewhere classified: Secondary | ICD-10-CM

## 2018-05-10 DIAGNOSIS — R2689 Other abnormalities of gait and mobility: Secondary | ICD-10-CM

## 2018-05-10 DIAGNOSIS — M25572 Pain in left ankle and joints of left foot: Secondary | ICD-10-CM

## 2018-05-10 DIAGNOSIS — M5441 Lumbago with sciatica, right side: Secondary | ICD-10-CM

## 2018-05-10 DIAGNOSIS — G8929 Other chronic pain: Secondary | ICD-10-CM

## 2018-05-10 DIAGNOSIS — M25672 Stiffness of left ankle, not elsewhere classified: Secondary | ICD-10-CM

## 2018-05-10 DIAGNOSIS — M5442 Lumbago with sciatica, left side: Secondary | ICD-10-CM

## 2018-05-10 NOTE — Therapy (Addendum)
[facility] [address]  [place], [place], [address] Phone: [phone]   Fax:  [phone]  Physical Therapy Treatment / Discharge Summary  Patient Details  Name: Angela Cook MRN: [id number] Date of Birth: 26-Aug-1955 Referring Provider (PT): Azucena Cecil, MD   Encounter Date: 05/10/2018  PT End of Session - 05/10/18 1534    Visit Number  22    Number of Visits  25    Date for PT Re-Evaluation  05/13/18    Authorization Type  Aetna (no ionto)    PT Start Time  1528    PT Stop Time  1606    PT Time Calculation (min)  38 min    Activity Tolerance  Patient tolerated treatment well    Behavior During Therapy  [facility] for tasks assessed/performed       Past Medical History:  Diagnosis Date  . Arthritis   . Asthma    as a child, no issues since age 51  . Constipation due to opioid therapy   . Fibromyalgia    legs from back   . Pneumonia    as a child    Past Surgical History:  Procedure Laterality Date  . BACK SURGERY  07/05/13, 2015   lumbar 4-5, 2015- new bone graft inserted  . BUNIONECTOMY Left 2014  . CARPAL TUNNEL RELEASE Right 1986   left 1991  . CARPAL TUNNEL RELEASE Left   . CESAREAN SECTION  [phone]  . COLONOSCOPY    . LUMBAR LAMINECTOMY/DECOMPRESSION MICRODISCECTOMY Bilateral 01/07/2016   Procedure: DECOMPRESSIONL LUMBAR THREE-FOUR  WITH FORAMINOTOMIES ;  Surgeon: Kristeen Miss, MD;  Location: [facility];  Service: Neurosurgery;  Laterality: Bilateral;  . ORIF ANKLE FRACTURE Left 02/07/2018   Procedure: OPEN REDUCTION INTERNAL FIXATION (ORIF) LEFT BIMALLEOLAR ANKLE FRACTURE;  Surgeon: Leandrew Koyanagi, MD;  Location: [facility];  Service: Orthopedics;  Laterality: Left;  . SPINAL CORD STIMULATOR IMPLANT    . TUBAL LIGATION  1986    There were no vitals filed for this visit.  Subjective Assessment - 05/10/18 1533    Subjective  Pt. noting she returned to work yesterday without issue.       Pertinent History  L ankle trimalleolar ankle fracture s/p ORIF on 02/07/18; L4-5 MIS TLIF with iliac crest bone graft 07/05/2013; Revision of posterior interbody fusion 07/06/14; Bil laminotomies and foraminotomies L3-L4 with decompression of L3 and L4 nerve roots 01/07/16; Thoracic laminectomies with placement of implanted spinal stimulator 05/25/17    Patient Stated Goals  "reduce pain in R arm and buttocks" & "walk normally"    Currently in Pain?  Yes    Pain Score  3     Pain Location  Shoulder    Pain Orientation  Right    Pain Descriptors / Indicators  Aching    Pain Type  Chronic pain    Multiple Pain Sites  No                       [facility] Adult PT Treatment/Exercise - 05/10/18 1550      Self-Care   Self-Care  Other Self-Care Comments    Other Self-Care Comments   Final verbal screen of comprehensive HEP to check for any further questions and understanding      Shoulder Exercises: Sidelying   External Rotation  Right;10 reps    External Rotation Weight (lbs)  2      Shoulder Exercises: ROM/Strengthening  UBE (Upper Arm Bike)  Lvl 2.0, 3 min forward/3 min backwards     Cybex Row  15 reps    Cybex Row Limitations  15# x 15 reps     Wall Pushups  10 reps   2 sets on wall    Wall Pushups Limitations  2nd set with orange p-ball       Shoulder Exercises: Stretch   Corner Stretch  3 reps;30 seconds    Corner Stretch Limitations  low, mid, high doorway stretch     Other Shoulder Stretches  R shoulder IR sleeper stretrch x 30 sec              PT Education - 05/10/18 1805    Education Details  HEP update    Person(s) Educated  Patient    Methods  Explanation;Verbal cues;Handout;Demonstration    Comprehension  Verbalized understanding;Returned demonstration;Verbal cues required;Need further instruction       PT Short Term Goals - 03/31/18 1130      PT SHORT TERM GOAL #1   Title  Independent with initial HEP    Status  Achieved      PT SHORT TERM GOAL  #2   Title  Pt will verbalize/demonstrate understanding of appropriate posture and body mechanics needed for daily activities including work station set-up    Baseline  Patient to demonstrate     Status  Achieved        PT Long Term Goals - 05/10/18 1536      PT LONG TERM GOAL #1   Title  Independent with ongoing/advanced HEP    Status  Achieved      PT LONG TERM GOAL #2   Title  Pt will report ability to sit for >/= 30 minutes w/o limitation due to buttock pain to allow for increased focus at work and improved tolerance for driving    Status  Achieved      PT LONG TERM GOAL #3   Title  Pt will report ability to complete work tasks on computer inlcuding use of mouse with pain </= 3/10    Status  Achieved      PT LONG TERM GOAL #4   Title  L ankle ROM WFL w/o increased pain to allow for normal gait pattern    Status  Achieved      PT LONG TERM GOAL #5   Title  L ankle strength >/= 4/5 for improved gait stability    Status  Achieved      PT LONG TERM GOAL #6   Title  Pt will ambulate with normal gait pattern w/o AD w/o evidence of L ankle instability    Status  Achieved      PT LONG TERM GOAL #7   Title  Pt will return to full-time status at work w/o limitation due L ankle LOM, weakness or pain    Status  Achieved            Plan - 05/10/18 1537    Clinical Impression Statement  Pt. doing well today and wishes to go on 30-day hold from therapy with supervising PT approving this plan.  Pt. able to now achieved all LTG's from therapy and verbalizing understanding of comprehensive HEP.  Has now returned to full time work without issue.  Pt. now on 30-day hold from therapy.      PT Treatment/Interventions  Patient/family education;Manual techniques;Passive range of motion;Dry needling;Taping;Scar mobilization;Neuromuscular re-education;Therapeutic exercise;Therapeutic activities;Functional mobility training;Gait training;Stair training;Balance training;ADLs/Self Care Home  Management;Ultrasound;Moist Heat;Electrical Stimulation;Cryotherapy;Vasopneumatic Device;Iontophoresis 82m/ml Dexamethasone    PT Next Visit Plan  30-day hold    Consulted and Agree with Plan of Care  Patient       Patient will benefit from skilled therapeutic intervention in order to improve the following deficits and impairments:  Pain, Increased muscle spasms, Impaired flexibility, Hypomobility, Decreased range of motion, Decreased strength, Difficulty walking, Abnormal gait, Decreased activity tolerance, Decreased endurance, Decreased scar mobility, Postural dysfunction, Improper body mechanics  Visit Diagnosis: Stiffness of left ankle, not elsewhere classified  Pain in left ankle and joints of left foot  Difficulty in walking, not elsewhere classified  Other abnormalities of gait and mobility  Muscle weakness (generalized)  Chronic bilateral low back pain with bilateral sciatica  Pain of right upper arm  Cramp and spasm  Abnormal posture     Problem List Patient Active Problem List   Diagnosis Date Noted  . Chronic right shoulder pain 05/06/2018  . Closed displaced trimalleolar fracture of lower leg with routine healing 02/17/2018  . Lumbar stenosis with neurogenic claudication 01/07/2016  . Lumbar pseudoarthrosis 07/06/2014   MBess Harvest PTA 05/10/18 6:08 PM    [facility] [address] [place] [address]Phone: [phone]  Fax:  [phone] Name: Angela PARKISONMRN: [id number] of Birth: [birth year] PHYSICAL THERAPY DISCHARGE SUMMARY  Visits from Start of Care: 22  Current functional level related to goals / functional outcomes:   Refer to above clinical impression for status as of last visit on 05/10/18. Pt was placed on hold for 30 days and has not needed to return to PT, therefore will proceed with discharge from PT for this episode.   Remaining deficits:   As above.     Education / Equipment:   HEP  Plan: Patient agrees to discharge.  Patient goals were met. Patient is being discharged due to meeting the stated rehab goals.  ?????     JPercival Spanish PT, MPT 06/21/18, 3:02 PM  [facility] [place] [address]Phone: [phone]  Fax:  [phone]

## 2018-05-12 ENCOUNTER — Encounter: Payer: 59 | Admitting: Physical Therapy

## 2018-05-19 ENCOUNTER — Encounter: Payer: Self-pay | Admitting: Physical Therapy

## 2018-05-24 ENCOUNTER — Encounter (INDEPENDENT_AMBULATORY_CARE_PROVIDER_SITE_OTHER): Payer: Self-pay | Admitting: Orthopaedic Surgery

## 2018-05-24 ENCOUNTER — Other Ambulatory Visit (INDEPENDENT_AMBULATORY_CARE_PROVIDER_SITE_OTHER): Payer: Self-pay

## 2018-05-24 DIAGNOSIS — M25511 Pain in right shoulder: Secondary | ICD-10-CM

## 2018-05-24 NOTE — Telephone Encounter (Signed)
Please order MRI shoulder to r/o structural abnormalities.

## 2018-06-02 ENCOUNTER — Ambulatory Visit (INDEPENDENT_AMBULATORY_CARE_PROVIDER_SITE_OTHER): Payer: 59 | Admitting: Orthopaedic Surgery

## 2018-06-04 ENCOUNTER — Encounter (HOSPITAL_BASED_OUTPATIENT_CLINIC_OR_DEPARTMENT_OTHER): Payer: Self-pay

## 2018-06-04 ENCOUNTER — Ambulatory Visit (HOSPITAL_BASED_OUTPATIENT_CLINIC_OR_DEPARTMENT_OTHER): Payer: 59

## 2018-06-14 ENCOUNTER — Ambulatory Visit (INDEPENDENT_AMBULATORY_CARE_PROVIDER_SITE_OTHER): Payer: 59 | Admitting: Orthopaedic Surgery

## 2018-06-14 ENCOUNTER — Encounter (INDEPENDENT_AMBULATORY_CARE_PROVIDER_SITE_OTHER): Payer: Self-pay | Admitting: Orthopaedic Surgery

## 2018-06-14 ENCOUNTER — Ambulatory Visit (INDEPENDENT_AMBULATORY_CARE_PROVIDER_SITE_OTHER): Payer: 59

## 2018-06-14 DIAGNOSIS — S82852D Displaced trimalleolar fracture of left lower leg, subsequent encounter for closed fracture with routine healing: Secondary | ICD-10-CM

## 2018-06-14 DIAGNOSIS — M25511 Pain in right shoulder: Secondary | ICD-10-CM | POA: Diagnosis not present

## 2018-06-14 DIAGNOSIS — M7541 Impingement syndrome of right shoulder: Secondary | ICD-10-CM

## 2018-06-14 NOTE — Progress Notes (Signed)
She is here for ultrasound imaging of right shoulder.  Chronic pain for at least a year with no improvement after subacromial injection and physical therapy.  Musculoskeletal ultrasound: Using a 12 MHz linear probe, long head biceps tendon is intact but she has fluid in the tendon sheath.  It is located in its groove.  AC joint has mild degenerative change with no significant effusion.  Subscapularis tendon has normal echotexture, no sign of tear.  Supraspinatus has a tear of the leading edge, full-thickness with retraction, involving about 50% of the tendon.  The posterior 50% still looks intact.  Infraspinatus seems to be normal.  Impression: Right shoulder full-thickness partial tear of supraspinatus tendon.  Plan: She will discuss surgical repair with Dr. Roda ShuttersXu today.

## 2018-06-14 NOTE — Addendum Note (Signed)
Addended by: Mayra ReelXU, N MICHAEL on: 06/14/2018 03:10 PM   Modules accepted: Level of Service

## 2018-06-14 NOTE — Progress Notes (Addendum)
Office Visit Note   Patient: Angela Cook           Date of Birth: Oct 25, 1955           MRN: [id number] Visit Date: 06/14/2018              Requested by: Deloris Pingyter-Brown, Sherry M, MD [address] [place], [place] [id number] PCP: Deloris Pingyter-Brown, Sherry M, MD   Assessment & Plan: Visit Diagnoses:  1. Closed displaced trimalleolar fracture of left ankle with routine healing, subsequent encounter   2. Right shoulder pain, unspecified chronicity   3. Impingement syndrome of right shoulder     Plan: Impression is right shoulder rotator cuff tear and biceps tendinopathy.  We we will have Dr. Prince RomeHilts evaluate her right shoulder with an ultrasound to hopefully shed light on what is going on inside her shoulder.  In terms of her ankle she is doing well.  After review of the ultrasound, she has a full thickness mildly retracted supraspinatus tear. She has biceps tenosynovitis.  This was reviewed with the patient today.  We discussed surgical repair of the supraspinatus tear as well as evaluation of the biceps tendon and the AC joint and the subacromial space.  Risks and benefits and rehab were discussed with the patient.  She elects to proceed with surgery in the near future.  Follow-Up Instructions: Return if symptoms worsen or fail to improve.   Orders:  Orders Placed This Encounter  Procedures  . XR Ankle Complete Left   No orders of the defined types were placed in this encounter.     Procedures: No procedures performed   Clinical Data: No additional findings.   Subjective: Chief Complaint  Patient presents with  . Left Ankle - Pain, Follow-up    Angela Cook is status post left ankle fracture with fixation.  She is doing well and reports no complaints at all.  Shoulder continues to bother her.  Overall this has been going on for a year.  She has done 8 weeks of physical therapy and she has had 2 injections without any relief.  She has a spinal cord stimulator for chronic  back pain.  She is unable to have MRI because of this.   Review of Systems   Objective: Vital Signs: There were no vitals taken for this visit.  Physical Exam  Ortho Exam Right shoulder exam shows very positive Speed test.  Negative O'Brien.  Mildly positive empty can and infraspinatus testing.  Negative impingement.  Negative cross adduction. Specialty Comments:  No specialty comments available.  Imaging: Xr Ankle Complete Left  Result Date: 06/14/2018 X-rays demonstrate a fully healed ankle fracture with stable hardware.    PMFS History: Patient Active Problem List   Diagnosis Date Noted  . Chronic right shoulder pain 05/06/2018  . Closed displaced trimalleolar fracture of lower leg with routine healing 02/17/2018  . Lumbar stenosis with neurogenic claudication 01/07/2016  . Lumbar pseudoarthrosis 07/06/2014   Past Medical History:  Diagnosis Date  . Arthritis   . Asthma    as a child, no issues since age 89  . Constipation due to opioid therapy   . Fibromyalgia    legs from back   . Pneumonia    as a child    No family history on file.  Past Surgical History:  Procedure Laterality Date  . BACK SURGERY  07/05/13, 2015   lumbar 4-5, 2015- new bone graft inserted  . BUNIONECTOMY Left 2014  . CARPAL  TUNNEL RELEASE Right 1986   left 1991  . CARPAL TUNNEL RELEASE Left   . CESAREAN SECTION  [phone]  . COLONOSCOPY    . LUMBAR LAMINECTOMY/DECOMPRESSION MICRODISCECTOMY Bilateral 01/07/2016   Procedure: DECOMPRESSIONL LUMBAR THREE-FOUR  WITH FORAMINOTOMIES ;  Surgeon: Barnett Abu, MD;  Location: [facility];  Service: Neurosurgery;  Laterality: Bilateral;  . ORIF ANKLE FRACTURE Left 02/07/2018   Procedure: OPEN REDUCTION INTERNAL FIXATION (ORIF) LEFT BIMALLEOLAR ANKLE FRACTURE;  Surgeon: Tarry Kos, MD;  Location: [facility];  Service: Orthopedics;  Laterality: Left;  . SPINAL CORD STIMULATOR IMPLANT    . TUBAL LIGATION  1986   Social History   Occupational  History  . Not on file  Tobacco Use  . Smoking status: Never Smoker  . Smokeless tobacco: Never Used  Substance and Sexual Activity  . Alcohol use: No    Frequency: Never  . Drug use: No  . Sexual activity: Not on file

## 2018-06-19 ENCOUNTER — Encounter (INDEPENDENT_AMBULATORY_CARE_PROVIDER_SITE_OTHER): Payer: Self-pay | Admitting: Orthopaedic Surgery

## 2018-06-21 ENCOUNTER — Other Ambulatory Visit (HOSPITAL_BASED_OUTPATIENT_CLINIC_OR_DEPARTMENT_OTHER): Payer: Self-pay | Admitting: Family Medicine

## 2018-06-21 DIAGNOSIS — Z1231 Encounter for screening mammogram for malignant neoplasm of breast: Secondary | ICD-10-CM

## 2018-06-25 ENCOUNTER — Ambulatory Visit (HOSPITAL_BASED_OUTPATIENT_CLINIC_OR_DEPARTMENT_OTHER)
Admission: RE | Admit: 2018-06-25 | Discharge: 2018-06-25 | Disposition: A | Discharge status: Home / Self Care | Payer: 59 | Source: Ambulatory Visit | Attending: Family Medicine | Admitting: Family Medicine

## 2018-06-25 DIAGNOSIS — Z1231 Encounter for screening mammogram for malignant neoplasm of breast: Secondary | ICD-10-CM | POA: Diagnosis present

## 2018-07-07 ENCOUNTER — Encounter (INDEPENDENT_AMBULATORY_CARE_PROVIDER_SITE_OTHER): Payer: Self-pay | Admitting: Orthopaedic Surgery

## 2018-07-07 ENCOUNTER — Encounter: Payer: Self-pay | Admitting: Orthopaedic Surgery

## 2018-07-08 ENCOUNTER — Telehealth (INDEPENDENT_AMBULATORY_CARE_PROVIDER_SITE_OTHER): Payer: Self-pay | Admitting: Orthopaedic Surgery

## 2018-07-08 ENCOUNTER — Encounter (INDEPENDENT_AMBULATORY_CARE_PROVIDER_SITE_OTHER): Payer: Self-pay | Admitting: Orthopaedic Surgery

## 2018-07-08 NOTE — Telephone Encounter (Signed)
Called patient and would like a call from you

## 2018-07-08 NOTE — Telephone Encounter (Signed)
December 31 st is fine.

## 2018-07-08 NOTE — Telephone Encounter (Signed)
Patient called this morning to get her post op appointment scheduled.  Dr. Roda ShuttersXu did not have anything until December 31st which is more than a week after the surgery.  She stated that she has some questions in regards to the sling and other things.  CB#[phone].  Thank you.

## 2018-07-11 DIAGNOSIS — S43431A Superior glenoid labrum lesion of right shoulder, initial encounter: Secondary | ICD-10-CM | POA: Diagnosis not present

## 2018-07-11 DIAGNOSIS — M7541 Impingement syndrome of right shoulder: Secondary | ICD-10-CM

## 2018-07-11 DIAGNOSIS — M19011 Primary osteoarthritis, right shoulder: Secondary | ICD-10-CM | POA: Diagnosis not present

## 2018-07-11 DIAGNOSIS — M75111 Incomplete rotator cuff tear or rupture of right shoulder, not specified as traumatic: Secondary | ICD-10-CM

## 2018-07-11 NOTE — Telephone Encounter (Signed)
Patient would like a call from you to discuss about sling and other things.CB# is [phone].  Please advise.  Thank you

## 2018-07-19 ENCOUNTER — Ambulatory Visit (INDEPENDENT_AMBULATORY_CARE_PROVIDER_SITE_OTHER): Payer: 59 | Admitting: Orthopaedic Surgery

## 2018-07-19 DIAGNOSIS — Z9889 Other specified postprocedural states: Secondary | ICD-10-CM

## 2018-07-19 NOTE — Progress Notes (Signed)
   Post-Op Visit Note   Patient: Angela Cook           Date of Birth: 05-09-1956           MRN: [id number] Visit Date: 07/19/2018 PCP: Deloris Pingyter-Brown, Sherry M, MD   Assessment & Plan:  Chief Complaint:  Chief Complaint  Patient presents with  . Right Shoulder - Follow-up   Visit Diagnoses:  1. Status post arthroscopy of right shoulder     Plan: Angela Cook is two-week status post right shoulder arthroscopy with debridement of the rotator cuff and biceps tenotomy.  She is overall doing well.  She does not report any pain.  Her surgical incisions have healed without any evidence of infection.  Sutures were removed today.  Her active and passive range of motion of her right shoulder is actually quite good.  She has some mild bruising.  Referral for physical therapy was given today.  She may return back to work on Thursday.  Recheck in 4 weeks.  Follow-Up Instructions: Return in about 4 weeks (around 08/16/2018).   Orders:  No orders of the defined types were placed in this encounter.  No orders of the defined types were placed in this encounter.   Imaging: No results found.  PMFS History: Patient Active Problem List   Diagnosis Date Noted  . Chronic right shoulder pain 05/06/2018  . Closed displaced trimalleolar fracture of lower leg with routine healing 02/17/2018  . Lumbar stenosis with neurogenic claudication 01/07/2016  . Lumbar pseudoarthrosis 07/06/2014   Past Medical History:  Diagnosis Date  . Arthritis   . Asthma    as a child, no issues since age 89  . Constipation due to opioid therapy   . Fibromyalgia    legs from back   . Pneumonia    as a child    No family history on file.  Past Surgical History:  Procedure Laterality Date  . BACK SURGERY  07/05/13, 2015   lumbar 4-5, 2015- new bone graft inserted  . BUNIONECTOMY Left 2014  . CARPAL TUNNEL RELEASE Right 1986   left 1991  . CARPAL TUNNEL RELEASE Left   . CESAREAN SECTION  [phone],[id number]  . COLONOSCOPY      . LUMBAR LAMINECTOMY/DECOMPRESSION MICRODISCECTOMY Bilateral 01/07/2016   Procedure: DECOMPRESSIONL LUMBAR THREE-FOUR  WITH FORAMINOTOMIES ;  Surgeon: Barnett AbuHenry Elsner, MD;  Location: [facility];  Service: Neurosurgery;  Laterality: Bilateral;  . ORIF ANKLE FRACTURE Left 02/07/2018   Procedure: OPEN REDUCTION INTERNAL FIXATION (ORIF) LEFT BIMALLEOLAR ANKLE FRACTURE;  Surgeon: Tarry KosXu, Jaishaun Mcnab M, MD;  Location: [facility];  Service: Orthopedics;  Laterality: Left;  . SPINAL CORD STIMULATOR IMPLANT    . TUBAL LIGATION  1986   Social History   Occupational History  . Not on file  Tobacco Use  . Smoking status: Never Smoker  . Smokeless tobacco: Never Used  Substance and Sexual Activity  . Alcohol use: No    Frequency: Never  . Drug use: No  . Sexual activity: Not on file

## 2018-07-27 ENCOUNTER — Encounter: Payer: Self-pay | Admitting: Physical Therapy

## 2018-07-27 ENCOUNTER — Other Ambulatory Visit: Payer: Self-pay

## 2018-07-27 ENCOUNTER — Ambulatory Visit: Payer: 59 | Attending: Orthopaedic Surgery | Admitting: Physical Therapy

## 2018-07-27 DIAGNOSIS — M6281 Muscle weakness (generalized): Secondary | ICD-10-CM | POA: Diagnosis present

## 2018-07-27 DIAGNOSIS — R29898 Other symptoms and signs involving the musculoskeletal system: Secondary | ICD-10-CM | POA: Diagnosis present

## 2018-07-27 DIAGNOSIS — M25611 Stiffness of right shoulder, not elsewhere classified: Secondary | ICD-10-CM

## 2018-07-27 DIAGNOSIS — M25511 Pain in right shoulder: Secondary | ICD-10-CM

## 2018-07-27 NOTE — Therapy (Signed)
[facility] [address]  [address] [place], [place], [address] Phone: [phone]   Fax:  [phone]  Physical Therapy Evaluation  Patient Details  Name: Angela Cook MRN: [id number] Date of Birth: 03/05/1956 Referring Provider (PT): Gershon Mussel, MD   Encounter Date: 07/27/2018  PT End of Session - 07/27/18 1052    Visit Number  1    Number of Visits  17    Date for PT Re-Evaluation  09/21/18    Authorization Type  [employer]    PT Start Time  1010    PT Stop Time  1110    PT Time Calculation (min)  60 min    Activity Tolerance  Patient tolerated treatment well;Patient limited by pain    Behavior During Therapy  [facility] for tasks assessed/performed       Past Medical History:  Diagnosis Date  . Arthritis   . Asthma    as a child, no issues since age 47  . Constipation due to opioid therapy   . Fibromyalgia    legs from back   . Pneumonia    as a child    Past Surgical History:  Procedure Laterality Date  . BACK SURGERY  07/05/13, 2015   lumbar 4-5, 2015- new bone graft inserted  . BUNIONECTOMY Left 2014  . CARPAL TUNNEL RELEASE Right 1986   left 1991  . CARPAL TUNNEL RELEASE Left   . CESAREAN SECTION  [phone]  . COLONOSCOPY    . LUMBAR LAMINECTOMY/DECOMPRESSION MICRODISCECTOMY Bilateral 01/07/2016   Procedure: DECOMPRESSIONL LUMBAR THREE-FOUR  WITH FORAMINOTOMIES ;  Surgeon: Barnett Abu, MD;  Location: [facility];  Service: Neurosurgery;  Laterality: Bilateral;  . ORIF ANKLE FRACTURE Left 02/07/2018   Procedure: OPEN REDUCTION INTERNAL FIXATION (ORIF) LEFT BIMALLEOLAR ANKLE FRACTURE;  Surgeon: Tarry Kos, MD;  Location: [facility];  Service: Orthopedics;  Laterality: Left;  . SPINAL CORD STIMULATOR IMPLANT    . TUBAL LIGATION  1986    There were no vitals filed for this visit.   Subjective Assessment - 07/27/18 1013    Subjective  Patient reports that she underwent R shoulder arthroscopy and biceps tenotomy  on 07/07/18. Wore sling for 3 days. US showed supraspinatus tear and labral tear, but these were not repaired. Per MD- lifting restriction to 5lbs. R shoulder pain is diffuse, with increased pain at anterior upper arm with movement. Having trouble with overhead reaching, donning undergarments, toilet hygiene, washing back, laying flat in supine, putting on make-up, brushing teeth. 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 Ice, heat, and Motrin helps.    Pertinent History   L4-5 MIS TLIF with iliac crest bone graft 07/05/2013; Revision of posterior interbody fusion 07/06/14; Bil laminotomies and foraminotomies L3-L4 with decompression of L3 and L4 nerve roots 01/07/16; Thoracic laminectomies with placement of implanted spinal stimulator 05/25/17; L ankle trimalleolar ankle fracture s/p ORIF on 02/07/18     Limitations  House hold activities;Writing;Lifting    How long can you sit comfortably?  2 hours sitting at desk at work    Diagnostic tests  Per MD- R shoulder US showed  full thickness mildly retracted supraspinatus tear & biceps tenosynovitis    Patient Stated Goals  relatively low pain levels    Currently in Pain?  Yes    Pain Score  4     Pain Location  Shoulder    Pain Orientation  Right;Anterior  Pain Descriptors / Indicators  Tightness;Aching    Pain Type  Acute pain;Surgical pain    Pain Radiating Towards  down R arm         [facility] PT Assessment - 07/27/18 1027      Assessment   Medical Diagnosis  S/P R shoulder scope, biceps tenotomy, RTC debridement    Referring Provider (PT)  Gershon Mussel, MD    Onset Date/Surgical Date  07/07/18    Hand Dominance  Right    Next MD Visit  08/25/18    Prior Therapy  Yes- for shoulder and LBP      Precautions   Precautions  Other (comment)   Cache stimulator, 5# lifting restriction on R UE     Restrictions   Weight Bearing Restrictions  No      Balance Screen   Has the patient fallen in the past 6 months  Yes     How many times?  1- fractured ankle    Has the patient had a decrease in activity level because of a fear of falling?   No    Is the patient reluctant to leave their home because of a fear of falling?   No      Home Environment   Living Environment  Private residence    Living Arrangements  Spouse/significant other    Type of Home  House    Home Access  Stairs to enter    Entrance Stairs-Number of Steps  2    Entrance Stairs-Rails  None    Home Layout  Two level;Able to live on main level with bedroom/bathroom      Prior Function   Level of Independence  Independent    Vocation  Full time employment    Vocation Requirements  computer work    Leisure  sewing, Associate Professor   Overall Cognitive Status  Within Functional Limits for tasks assessed      Observation/Other Assessments   Focus on Therapeutic Outcomes (FOTO)   Shoulder: 59 (41% limited, 25% predicted)      Sensation   Light Touch  Appears Intact      Coordination   Gross Motor Movements are Fluid and Coordinated  Yes      Posture/Postural Control   Posture/Postural Control  Postural limitations    Postural Limitations  Rounded Shoulders;Forward head      ROM / Strength   AROM / PROM / Strength  AROM;PROM;Strength      AROM   AROM Assessment Site  Shoulder    Right/Left Shoulder  Left;Right    Right Shoulder Flexion  136 Degrees   moderate pain   Right Shoulder ABduction  141 Degrees   moderate pain   Right Shoulder Internal Rotation  --   TIR T9; moderate pain   Right Shoulder External Rotation  --   FER T2; moderate pain   Left Shoulder Flexion  150 Degrees    Left Shoulder ABduction  134 Degrees    Left Shoulder Internal Rotation  --   FIR T5   Left Shoulder External Rotation  --   FER T2     PROM   PROM Assessment Site  Shoulder    Right/Left Shoulder  Right;Left    Right Shoulder Flexion  150 Degrees    Right Shoulder ABduction  133 Degrees   slight scaption   Right Shoulder  Internal Rotation  85 Degrees    Right Shoulder External Rotation  90 Degrees      Strength   Strength Assessment Site  Shoulder;Elbow    Right/Left Shoulder  Right;Left   mild resistance given on R UE; pt reported mild discomfort   Right Shoulder Flexion  3+/5    Right Shoulder ABduction  3+/5    Right Shoulder Internal Rotation  3+/5    Right Shoulder External Rotation  3+/5    Left Shoulder Flexion  4+/5    Left Shoulder ABduction  4+/5    Left Shoulder Internal Rotation  4+/5    Left Shoulder External Rotation  4/5    Right/Left Elbow  Right;Left    Right Elbow Flexion  4/5    Right Elbow Extension  4+/5    Left Elbow Flexion  4+/5    Left Elbow Extension  4+/5      Palpation   Palpation comment  TTP in R biceps muscle belly and pec                Objective measurements completed on examination: See above findings.      [facility] Adult PT Treatment/Exercise - 07/27/18 1027      Shoulder Exercises: Supine   External Rotation  Right;AAROM;10 reps    External Rotation Limitations  wand; bolster under elbow to elevate into scapular plane     Flexion  Right;AAROM;10 reps   Cues required for proper movement pattern    Flexion Limitations  wand       Shoulder Exercises: Standing   Flexion  Right;10 reps;AAROM    Flexion Limitations  flexion wall slide with towel       Vasopneumatic   Number Minutes Vasopneumatic   10 minutes    Vasopnuematic Location   Shoulder   R   Vasopneumatic Pressure  Low    Vasopneumatic Temperature   coldest temp             PT Education - 07/27/18 1052    Education Details  prognosis, POC, HEP    Person(s) Educated  Patient    Methods  Explanation;Demonstration;Tactile cues;Verbal cues;Handout    Comprehension  Verbalized understanding;Returned demonstration;Verbal cues required;Need further instruction       PT Short Term Goals - 07/27/18 1058      PT SHORT TERM GOAL #1   Title  Independent with initial HEP    Time  8     Period  Weeks    Status  New    Target Date  09/21/18        PT Long Term Goals - 07/27/18 1058      PT LONG TERM GOAL #1   Title  Patient to be independent with advanced HEP.    Time  8    Period  Weeks    Status  New    Target Date  09/21/18      PT LONG TERM GOAL #2   Title  Patient to demonstrate >=4+/5 strength in R shoulder.     Time  8    Period  Weeks    Status  New    Target Date  09/21/18      PT LONG TERM GOAL #3   Title  Pt will report ability to complete work tasks on computer inlcuding use of mouse with pain </= 3/10    Time  8    Period  Weeks    Status  New    Target Date  09/21/18      PT LONG TERM GOAL #  4   Title  Patient to demonstrate R shoulder AROM/PROM [facility] and pain free.     Time  8    Period  Weeks    Status  New    Target Date  09/21/18      PT LONG TERM GOAL #5   Title  Patient to perform overhead reach with 5lbs with good scapular mechanics.     Time  8    Period  Weeks    Status  New    Target Date  09/21/18             Plan - 07/27/18 1053    Clinical Impression Statement  Patient is a 62y/o F presenting to OPPT with c/o R shoulder pain s/p R shoulder arthroscopy and biceps tenotomy on 07/07/18. Patient still with existing supraspinatus tear and labral tear found on US. Placed on 5 lb lifting restriction. Patient now out of sling, notes that she is having trouble with overhead reaching, donning undergarments, toilet hygiene, washing back, laying flat in supine, putting on make-up, brushing teeth. Patient today with limited and painful R shoulder AROM/PROM, decreased strength, abnormal posture, and TTP in R biceps muscle belly and pec. Educated on gentle stretching and strengthening HEP and advised to stay within tolerable limits. Patient reported understanding. Ended session with [employer] to R shoulder for pain relief. Would benefit from skilled PT services 2x/week for 8 weeks to address aforementioned impairments.     Clinical  Presentation  Stable    Clinical Decision Making  Low    Rehab Potential  Good    Clinical Impairments Affecting Rehab Potential   L4-5 MIS TLIF with iliac crest bone graft 07/05/2013; Revision of posterior interbody fusion 07/06/14; Bil laminotomies and foraminotomies L3-L4 with decompression of L3 and L4 nerve roots 01/07/16; Thoracic laminectomies with placement of implanted spinal stimulator 05/25/17; L ankle trimalleolar ankle fracture s/p ORIF on 02/07/18     PT Frequency  2x / week    PT Duration  8 weeks    PT Treatment/Interventions  ADLs/Self Care Home Management;Cryotherapy;Moist Heat;Therapeutic activities;Therapeutic exercise;Neuromuscular re-education;Patient/family education;Passive range of motion;Scar mobilization;Manual techniques;Dry needling;Energy conservation;Splinting;Taping;Vasopneumatic Device    PT Next Visit Plan  reassess HEP    Consulted and Agree with Plan of Care  Patient       Patient will benefit from skilled therapeutic intervention in order to improve the following deficits and impairments:  Decreased range of motion, Impaired UE functional use, Increased muscle spasms, Decreased activity tolerance, Pain, Improper body mechanics, Impaired flexibility, Decreased scar mobility, Decreased strength, Increased edema, Postural dysfunction  Visit Diagnosis: Acute pain of right shoulder  Stiffness of right shoulder, not elsewhere classified  Muscle weakness (generalized)  Other symptoms and signs involving the musculoskeletal system     Problem List Patient Active Problem List   Diagnosis Date Noted  . Chronic right shoulder pain 05/06/2018  . Closed displaced trimalleolar fracture of lower leg with routine healing 02/17/2018  . Lumbar stenosis with neurogenic claudication 01/07/2016  . Lumbar pseudoarthrosis 07/06/2014    PTA instructed pt. in initial home program with pt. tolerating R shoulder supine wand AAROM ER and flexion activities well requiring min  cueing for proper pacing and to stay away from painful arc of motion.  Also tolerated flexion wall slide well today.  Ended visit with ice/compression to R shoulder to reduce post-exercise soreness and swelling.    Anette GuarneriYevgeniya Mackson Botz, PT, DPT 07/27/18 1:07 PM   Kermit BaloMicah Denny, PTA 07/27/18 1:06 PM  [facility] [address]  [address] [place], [place], [address] Phone: [phone]   Fax:  [phone]  Name: SHIRLYN SUTLIFF MRN: [id number] Date of Birth: [birth year]

## 2018-08-03 ENCOUNTER — Ambulatory Visit: Payer: 59

## 2018-08-03 DIAGNOSIS — M25511 Pain in right shoulder: Secondary | ICD-10-CM

## 2018-08-03 DIAGNOSIS — M6281 Muscle weakness (generalized): Secondary | ICD-10-CM

## 2018-08-03 DIAGNOSIS — R29898 Other symptoms and signs involving the musculoskeletal system: Secondary | ICD-10-CM

## 2018-08-03 DIAGNOSIS — M25611 Stiffness of right shoulder, not elsewhere classified: Secondary | ICD-10-CM

## 2018-08-03 NOTE — Therapy (Signed)
[facility] [address]  [place], [place], [address] Phone: [phone]   Fax:  [phone]  Physical Therapy Treatment  Patient Details  Name: Angela Cook MRN: [id number] Date of Birth: [birth year] Referring Provider (PT): Frankey Shown, MD   Encounter Date: 08/03/2018  PT End of Session - 08/03/18 0850    Visit Number  2    Number of Visits  17    Date for PT Re-Evaluation  09/21/18    Authorization Type  BCBS    PT Start Time  0845    PT Stop Time  0940    PT Time Calculation (min)  55 min    Activity Tolerance  Patient tolerated treatment well;Patient limited by pain    Behavior During Therapy  [facility] for tasks assessed/performed       Past Medical History:  Diagnosis Date  . Arthritis   . Asthma    as a child, no issues since age 74  . Constipation due to opioid therapy   . Fibromyalgia    legs from back   . Pneumonia    as a child    Past Surgical History:  Procedure Laterality Date  . BACK SURGERY  07/05/13, 2015   lumbar 4-5, 2015- new bone graft inserted  . BUNIONECTOMY Left 2014  . CARPAL TUNNEL RELEASE Right 1986   left 1991  . CARPAL TUNNEL RELEASE Left   . CESAREAN SECTION  [phone]  . COLONOSCOPY    . LUMBAR LAMINECTOMY/DECOMPRESSION MICRODISCECTOMY Bilateral 01/07/2016   Procedure: DECOMPRESSIONL LUMBAR THREE-FOUR  WITH FORAMINOTOMIES ;  Surgeon: Kristeen Miss, MD;  Location: [place] [facility];  Service: Neurosurgery;  Laterality: Bilateral;  . ORIF ANKLE FRACTURE Left 02/07/2018   Procedure: OPEN REDUCTION INTERNAL FIXATION (ORIF) LEFT BIMALLEOLAR ANKLE FRACTURE;  Surgeon: Leandrew Koyanagi, MD;  Location: [place];  Service: Orthopedics;  Laterality: Left;  . SPINAL CORD STIMULATOR IMPLANT    . TUBAL LIGATION  1986    There were no vitals filed for this visit.  Subjective Assessment - 08/03/18 0848    Subjective  Doing well.  Is having dull R shoulder pain most in mornings which subsides as  day continues.      Pertinent History   L4-5 MIS TLIF with iliac crest bone graft 07/05/2013; Revision of posterior interbody fusion 07/06/14; Bil laminotomies and foraminotomies L3-L4 with decompression of L3 and L4 nerve roots 01/07/16; Thoracic laminectomies with placement of implanted spinal stimulator 05/25/17; L ankle trimalleolar ankle fracture s/p ORIF on 02/07/18     Diagnostic tests  Per MD- R shoulder US showed  full thickness mildly retracted supraspinatus tear & biceps tenosynovitis    Patient Stated Goals  relatively low pain levels    Currently in Pain?  Yes    Pain Score  7     Pain Location  Shoulder    Pain Orientation  Right;Anterior    Pain Descriptors / Indicators  Dull    Pain Type  Acute pain;Surgical pain    Multiple Pain Sites  No                       [facility] Adult PT Treatment/Exercise - 08/03/18 0918      Shoulder Exercises: Supine   Protraction  Right;15 reps    Protraction Limitations  Guidance for proper motion     External Rotation  Right;AAROM;10 reps    External Rotation Limitations  wand; bolster under  elbow to elevate into scapular plane     Flexion  Right;AAROM;10 reps    Flexion Limitations  wand     Other Supine Exercises  R shouder small circles CW, CCW x 10 rpes each way     Other Supine Exercises  Hooklying AROM ER/IR with elbow elevated on bolster x 15 reps    Cues required for proper pacing      Shoulder Exercises: Seated   Retraction  Both;10 reps   5" hold      Shoulder Exercises: Sidelying   ABduction  Right;10 reps    ABduction Limitations  0-90 dg; with therapist tactile cueing for proper scapular upward rotation which relieved complaint of R superior shoulder pain       Vasopneumatic   Number Minutes Vasopneumatic   10 minutes    Vasopnuematic Location   Shoulder   R   Vasopneumatic Pressure  Low    Vasopneumatic Temperature   coldest temp      Manual Therapy   Manual Therapy  Passive ROM;Soft tissue mobilization     Manual therapy comments  supine     Soft tissue mobilization  STM to R shoulder complex    Passive ROM  R shoulder PROM all directions to tolerance                PT Short Term Goals - 08/03/18 0857      PT SHORT TERM GOAL #1   Title  Independent with initial HEP    Time  8    Period  Weeks    Status  Achieved        PT Long Term Goals - 08/03/18 0857      PT LONG TERM GOAL #1   Title  Patient to be independent with advanced HEP.    Time  8    Period  Weeks    Status  Partially Met      PT LONG TERM GOAL #2   Title  Patient to demonstrate >=4+/5 strength in R shoulder.     Time  8    Period  Weeks    Status  On-going      PT LONG TERM GOAL #3   Title  Pt will report ability to complete work tasks on computer inlcuding use of mouse with pain </= 3/10    Time  8    Period  Weeks    Status  Achieved      PT LONG TERM GOAL #4   Title  Patient to demonstrate R shoulder AROM/PROM WFL and pain free.     Time  8    Period  Weeks    Status  On-going      PT LONG TERM GOAL #5   Title  Patient to perform overhead reach with 5lbs with good scapular mechanics.     Time  8    Period  Weeks    Status  On-going            Plan - 08/03/18 0900    Clinical Impression Statement  Pt. doing well today.  Tolerated mild advancement in supine and sidelying AAROM and AROM activities well today.  Did require tactile cueing for proper scapular motion with sidelying abduction today which relieved mild complaint of R superior shoulder pain with this activity.  Does require cueing for proper pacing intermittently with therex to ensure proper muscular activation.  Ended visit with ice/compression to R shoulder to reduce post-exercise pain and  swelling.      Clinical Impairments Affecting Rehab Potential   L4-5 MIS TLIF with iliac crest bone graft 07/05/2013; Revision of posterior interbody fusion 07/06/14; Bil laminotomies and foraminotomies L3-L4 with decompression of L3 and L4  nerve roots 01/07/16; Thoracic laminectomies with placement of implanted spinal stimulator 05/25/17; L ankle trimalleolar ankle fracture s/p ORIF on 02/07/18     PT Frequency  2x / week    PT Duration  8 weeks    PT Treatment/Interventions  ADLs/Self Care Home Management;Cryotherapy;Moist Heat;Therapeutic activities;Therapeutic exercise;Neuromuscular re-education;Patient/family education;Passive range of motion;Scar mobilization;Manual techniques;Dry needling;Energy conservation;Splinting;Taping;Vasopneumatic Device    PT Next Visit Plan  Gentle progression of AAROM, AROM per pt.    Consulted and Agree with Plan of Care  Patient       Patient will benefit from skilled therapeutic intervention in order to improve the following deficits and impairments:  Decreased range of motion, Impaired UE functional use, Increased muscle spasms, Decreased activity tolerance, Pain, Improper body mechanics, Impaired flexibility, Decreased scar mobility, Decreased strength, Increased edema, Postural dysfunction  Visit Diagnosis: Acute pain of right shoulder  Stiffness of right shoulder, not elsewhere classified  Muscle weakness (generalized)  Other symptoms and signs involving the musculoskeletal system     Problem List Patient Active Problem List   Diagnosis Date Noted  . Chronic right shoulder pain 05/06/2018  . Closed displaced trimalleolar fracture of lower leg with routine healing 02/17/2018  . Lumbar stenosis with neurogenic claudication 01/07/2016  . Lumbar pseudoarthrosis 07/06/2014    Bess Harvest, PTA 08/03/18 4:02 PM   [facility] [address]  [place], [place], [address] Phone: [phone]   Fax:  [phone]  Name: Angela Cook MRN: [id number] Date of Birth: [birth year]

## 2018-08-09 ENCOUNTER — Ambulatory Visit: Payer: 59 | Admitting: Physical Therapy

## 2018-08-09 ENCOUNTER — Encounter: Payer: Self-pay | Admitting: Physical Therapy

## 2018-08-09 DIAGNOSIS — R29898 Other symptoms and signs involving the musculoskeletal system: Secondary | ICD-10-CM

## 2018-08-09 DIAGNOSIS — M25611 Stiffness of right shoulder, not elsewhere classified: Secondary | ICD-10-CM

## 2018-08-09 DIAGNOSIS — M25511 Pain in right shoulder: Secondary | ICD-10-CM

## 2018-08-09 DIAGNOSIS — M6281 Muscle weakness (generalized): Secondary | ICD-10-CM

## 2018-08-09 NOTE — Therapy (Signed)
[facility] [address]  [place], [place], [address] Phone: [phone]   Fax:  [phone]  Physical Therapy Treatment  Patient Details  Name: Angela Cook MRN: [id number] Date of Birth: Feb 04, 1956 Referring Provider (PT): Frankey Shown, MD   Encounter Date: 08/09/2018  PT End of Session - 08/09/18 0347    Visit Number  3    Number of Visits  17    Date for PT Re-Evaluation  09/21/18    Authorization Type  BCBS    PT Start Time  1706    PT Stop Time  1759    PT Time Calculation (min)  53 min    Activity Tolerance  Patient tolerated treatment well    Behavior During Therapy  [facility] for tasks assessed/performed       Past Medical History:  Diagnosis Date  . Arthritis   . Asthma    63, no issues since age 63  . Constipation due to opioid therapy   . Fibromyalgia    legs from back   . Pneumonia    63    Past Surgical History:  Procedure Laterality Date  . BACK SURGERY  07/05/13, 2015   lumbar 4-5, 2015- new bone graft inserted  . BUNIONECTOMY Left 2014  . CARPAL TUNNEL RELEASE Right 1986   left 1991  . CARPAL TUNNEL RELEASE Left   . CESAREAN SECTION  [phone]  . COLONOSCOPY    . LUMBAR LAMINECTOMY/DECOMPRESSION MICRODISCECTOMY Bilateral 01/07/2016   Procedure: DECOMPRESSIONL LUMBAR THREE-FOUR  WITH FORAMINOTOMIES ;  Surgeon: Kristeen Miss, MD;  Location: [place] [facility];  Service: Neurosurgery;  Laterality: Bilateral;  . ORIF ANKLE FRACTURE Left 02/07/2018   Procedure: OPEN REDUCTION INTERNAL FIXATION (ORIF) LEFT BIMALLEOLAR ANKLE FRACTURE;  Surgeon: Leandrew Koyanagi, MD;  Location: [place];  Service: Orthopedics;  Laterality: Left;  . SPINAL CORD STIMULATOR IMPLANT    . TUBAL LIGATION  1986    There were no vitals filed for this visit.  Subjective Assessment - 08/09/18 1708    Subjective  Reports she is late because she had a busy day. Reports she is still sleeping in recliner d/t discomfort  in R shoulder, even with medication. Having severe AM and PM pain.     Pertinent History   L4-5 MIS TLIF with iliac crest bone graft 07/05/2013; Revision of posterior interbody fusion 07/06/14; Bil laminotomies and foraminotomies L3-L4 with decompression of L3 and L4 nerve roots 01/07/16; Thoracic laminectomies with placement of implanted spinal stimulator 05/25/17; L ankle trimalleolar ankle fracture s/p ORIF on 02/07/18     Diagnostic tests  Per MD- R shoulder US showed  full thickness mildly retracted supraspinatus tear & biceps tenosynovitis    Patient Stated Goals  relatively low pain levels    Currently in Pain?  No/denies                       [facility] Adult PT Treatment/Exercise - 08/09/18 0001      Exercises   Exercises  Shoulder      Shoulder Exercises: Supine   Protraction  AAROM;Both;10 reps    Protraction Limitations  2x10; with band; cues to avoid elbow flexion    Flexion  Right;AAROM;10 reps    Flexion Limitations  wand     ABduction  AAROM;Right;10 reps    ABduction Limitations  wand, to tolerance      Shoulder Exercises: Seated   Other  Seated Exercises  R bicep curl x10 1# with cues for decreased speed      Shoulder Exercises: Sidelying   External Rotation  Strengthening;Right;10 reps    External Rotation Limitations  1st set 0#, 2nd set 1#; dowel under elbow; cues for scap retraction    ABduction  Right;10 reps;Strengthening    ABduction Limitations  to tolerance; thumb up      Shoulder Exercises: Pulleys   Flexion  3 minutes    Flexion Limitations  to tolerance    Scaption  3 minutes    Scaption Limitations  to tolerance      Vasopneumatic   Number Minutes Vasopneumatic   15 minutes    Vasopnuematic Location   Shoulder   R   Vasopneumatic Pressure  Low    Vasopneumatic Temperature   coldest temp      Manual Therapy   Manual Therapy  Passive ROM;Soft tissue mobilization    Manual therapy comments  supine     Soft tissue mobilization  STM to R  shoulder complex- most soft tissue restriction and tenderness in R pec     Passive ROM  R shoulder PROM all directions to tolerance with slight distraction for comfort             PT Education - 08/09/18 1747    Education Details  update to HEP    Person(s) Educated  Patient    Methods  Explanation;Demonstration;Tactile cues;Verbal cues;Handout    Comprehension  Verbalized understanding;Returned demonstration       PT Short Term Goals - 08/03/18 0857      PT SHORT TERM GOAL #1   Title  Independent with initial HEP    Time  8    Period  Weeks    Status  Achieved        PT Long Term Goals - 08/03/18 0857      PT LONG TERM GOAL #1   Title  Patient to be independent with advanced HEP.    Time  8    Period  Weeks    Status  Partially Met      PT LONG TERM GOAL #2   Title  Patient to demonstrate >=4+/5 strength in R shoulder.     Time  8    Period  Weeks    Status  On-going      PT LONG TERM GOAL #3   Title  Pt will report ability to complete work tasks on computer inlcuding use of mouse with pain </= 3/10    Time  8    Period  Weeks    Status  Achieved      PT LONG TERM GOAL #4   Title  Patient to demonstrate R shoulder AROM/PROM WFL and pain free.     Time  8    Period  Weeks    Status  On-going      PT LONG TERM GOAL #5   Title  Patient to perform overhead reach with 5lbs with good scapular mechanics.     Time  8    Period  Weeks    Status  On-going            Plan - 08/09/18 1748    Clinical Impression Statement  Patient arrived to session late, reporting that she has had a busy day. Reports she is still having trouble sleeping d/t R shoulder discomfort. Addressed R shoulder pain with STM with patient reporting more tenderness and soft tissue restriction  to R pec. Tolerated R shoulder PROM with most restriction in flexion, improved with slight shoulder distraction. Describing painful arc with flexion AAROM with wand, however this is expected d/t  current RTC tear. Provided manual/verbal cues for proper form with supine serratus punches. Able to tolerated 1lb weight with sidelying ER today with cues for slight scapular retraction. Updated HEP with exercises that were well tolerated today. Patient reported understanding. Ended session with [employer] to R shoulder for pain relief. No complaints at end of session.     Clinical Impairments Affecting Rehab Potential   L4-5 MIS TLIF with iliac crest bone graft 07/05/2013; Revision of posterior interbody fusion 07/06/14; Bil laminotomies and foraminotomies L3-L4 with decompression of L3 and L4 nerve roots 01/07/16; Thoracic laminectomies with placement of implanted spinal stimulator 05/25/17; L ankle trimalleolar ankle fracture s/p ORIF on 02/07/18     PT Treatment/Interventions  ADLs/Self Care Home Management;Cryotherapy;Moist Heat;Therapeutic activities;Therapeutic exercise;Neuromuscular re-education;Patient/family education;Passive range of motion;Scar mobilization;Manual techniques;Dry needling;Energy conservation;Splinting;Taping;Vasopneumatic Device    PT Next Visit Plan  progress AROM and RTC strengthening, gentle biceps strengthening     Consulted and Agree with Plan of Care  Patient       Patient will benefit from skilled therapeutic intervention in order to improve the following deficits and impairments:  Decreased range of motion, Impaired UE functional use, Increased muscle spasms, Decreased activity tolerance, Pain, Improper body mechanics, Impaired flexibility, Decreased scar mobility, Decreased strength, Increased edema, Postural dysfunction  Visit Diagnosis: Acute pain of right shoulder  Stiffness of right shoulder, not elsewhere classified  Muscle weakness (generalized)  Other symptoms and signs involving the musculoskeletal system     Problem List Patient Active Problem List   Diagnosis Date Noted  . Chronic right shoulder pain 05/06/2018  . Closed displaced trimalleolar  fracture of lower leg with routine healing 02/17/2018  . Lumbar stenosis with neurogenic claudication 01/07/2016  . Lumbar pseudoarthrosis 07/06/2014    Janene Harvey, PT, DPT 08/09/18 6:01 PM   [facility] [address]  [place], [place], [address] Phone: [phone]   Fax:  [phone]  Name: JAYDALEE BARDWELL MRN: [id number] Date of Birth: Jun 26, 1956

## 2018-08-11 ENCOUNTER — Ambulatory Visit: Payer: 59

## 2018-08-11 DIAGNOSIS — R29898 Other symptoms and signs involving the musculoskeletal system: Secondary | ICD-10-CM

## 2018-08-11 DIAGNOSIS — M25511 Pain in right shoulder: Secondary | ICD-10-CM | POA: Diagnosis not present

## 2018-08-11 DIAGNOSIS — M6281 Muscle weakness (generalized): Secondary | ICD-10-CM

## 2018-08-11 DIAGNOSIS — M25611 Stiffness of right shoulder, not elsewhere classified: Secondary | ICD-10-CM

## 2018-08-11 NOTE — Therapy (Signed)
[facility] [address]  [place], [place], [address] Phone: [phone]   Fax:  [phone]  Physical Therapy Treatment  Patient Details  Name: Angela Cook MRN: [id number] Date of Birth: 1955-11-07 Referring Provider (PT): Frankey Shown, MD   Encounter Date: 08/11/2018  PT End of Session - 08/11/18 0944    Visit Number  4    Number of Visits  17    Date for PT Re-Evaluation  09/21/18    Authorization Type  BCBS    PT Start Time  [id number]   pt. arrived late    PT Stop Time  1025   10 min moist heat   PT Time Calculation (min)  49 min    Activity Tolerance  Patient tolerated treatment well    Behavior During Therapy  [facility] for tasks assessed/performed       Past Medical History:  Diagnosis Date  . Arthritis   . Asthma    as a child, no issues since age 42  . Constipation due to opioid therapy   . Fibromyalgia    legs from back   . Pneumonia    as a child    Past Surgical History:  Procedure Laterality Date  . BACK SURGERY  07/05/13, 2015   lumbar 4-5, 2015- new bone graft inserted  . BUNIONECTOMY Left 2014  . CARPAL TUNNEL RELEASE Right 1986   left 1991  . CARPAL TUNNEL RELEASE Left   . CESAREAN SECTION  [phone]  . COLONOSCOPY    . LUMBAR LAMINECTOMY/DECOMPRESSION MICRODISCECTOMY Bilateral 01/07/2016   Procedure: DECOMPRESSIONL LUMBAR THREE-FOUR  WITH FORAMINOTOMIES ;  Surgeon: Kristeen Miss, MD;  Location: [place] [facility];  Service: Neurosurgery;  Laterality: Bilateral;  . ORIF ANKLE FRACTURE Left 02/07/2018   Procedure: OPEN REDUCTION INTERNAL FIXATION (ORIF) LEFT BIMALLEOLAR ANKLE FRACTURE;  Surgeon: Leandrew Koyanagi, MD;  Location: [place];  Service: Orthopedics;  Laterality: Left;  . SPINAL CORD STIMULATOR IMPLANT    . TUBAL LIGATION  1986    There were no vitals filed for this visit.  Subjective Assessment - 08/11/18 0941    Subjective  Pt. doing well today.  Had some soreness in R shoulder after  taking crock pot off top shelf this morning.      Pertinent History   L4-5 MIS TLIF with iliac crest bone graft 07/05/2013; Revision of posterior interbody fusion 07/06/14; Bil laminotomies and foraminotomies L3-L4 with decompression of L3 and L4 nerve roots 01/07/16; Thoracic laminectomies with placement of implanted spinal stimulator 05/25/17; L ankle trimalleolar ankle fracture s/p ORIF on 02/07/18     Limitations  House hold activities;Writing;Lifting    Diagnostic tests  Per MD- R shoulder US showed  full thickness mildly retracted supraspinatus tear & biceps tenosynovitis    Patient Stated Goals  relatively low pain levels    Currently in Pain?  Yes    Pain Score  1     Pain Location  Shoulder    Pain Orientation  Right;Anterior    Pain Descriptors / Indicators  Dull    Pain Type  Acute pain;Surgical pain    Pain Frequency  Intermittent    Multiple Pain Sites  No                       [facility] Adult PT Treatment/Exercise - 08/11/18 1329      Shoulder Exercises: Supine   Protraction  Right;15 reps    Protraction  Weight (lbs)  1    Protraction Limitations  Cues for proper motion       Shoulder Exercises: Seated   Other Seated Exercises  seated flexion slide on counter x 10 reps       Shoulder Exercises: Sidelying   External Rotation  Right;15 reps;Strengthening;AROM    External Rotation Limitations  Cues required for proper scapular retraction       Shoulder Exercises: Standing   External Rotation  Right;10 reps;Theraband;Strengthening    Theraband Level (Shoulder External Rotation)  Level 1 (Yellow)    External Rotation Limitations  isometric step outs     Internal Rotation  Right;10 reps;Theraband;Strengthening    Internal Rotation Limitations  isometric step outs    Flexion  Right;10 reps;AAROM    Flexion Limitations  orange p-ball wall ball rolls     Extension  Both;10 reps;Strengthening;Theraband    Theraband Level (Shoulder Extension)  Level 1 (Yellow)     Extension Limitations  cues for scapular retraction     Row  Both;10 reps;Theraband    Theraband Level (Shoulder Row)  Level 2 (Red)    Row Limitations  Cues required for scapular retraction       Shoulder Exercises: Pulleys   Flexion  3 minutes    Flexion Limitations  to tolerance    Scaption  3 minutes    Scaption Limitations  to tolerance               PT Short Term Goals - 08/03/18 0857      PT SHORT TERM GOAL #1   Title  Independent with initial HEP    Time  8    Period  Weeks    Status  Achieved        PT Long Term Goals - 08/03/18 0857      PT LONG TERM GOAL #1   Title  Patient to be independent with advanced HEP.    Time  8    Period  Weeks    Status  Partially Met      PT LONG TERM GOAL #2   Title  Patient to demonstrate >=4+/5 strength in R shoulder.     Time  8    Period  Weeks    Status  On-going      PT LONG TERM GOAL #3   Title  Pt will report ability to complete work tasks on computer inlcuding use of mouse with pain </= 3/10    Time  8    Period  Weeks    Status  Achieved      PT LONG TERM GOAL #4   Title  Patient to demonstrate R shoulder AROM/PROM WFL and pain free.     Time  8    Period  Weeks    Status  On-going      PT LONG TERM GOAL #5   Title  Patient to perform overhead reach with 5lbs with good scapular mechanics.     Time  8    Period  Weeks    Status  On-going            Plan - 08/11/18 6010    Clinical Impression Statement  Pt. able to tolerate progression of RTC strengthening activities and addition of isometric yellow TB resisted IR, ER step-outs well today.  Pt. with most difficulty with flexion wall slides today however only verbalized UE fatigue with this.  Did review sidelying ER per pt. request today as she was  unclear on proper technique.  Ended visit with ice pack to R shoulder to reduce post-exercise soreness and swelling.  Progressing well toward goals.      Rehab Potential  Good    Clinical Impairments  Affecting Rehab Potential   L4-5 MIS TLIF with iliac crest bone graft 07/05/2013; Revision of posterior interbody fusion 07/06/14; Bil laminotomies and foraminotomies L3-L4 with decompression of L3 and L4 nerve roots 01/07/16; Thoracic laminectomies with placement of implanted spinal stimulator 05/25/17; L ankle trimalleolar ankle fracture s/p ORIF on 02/07/18     PT Frequency  2x / week    PT Duration  8 weeks    PT Treatment/Interventions  ADLs/Self Care Home Management;Cryotherapy;Moist Heat;Therapeutic activities;Therapeutic exercise;Neuromuscular re-education;Patient/family education;Passive range of motion;Scar mobilization;Manual techniques;Dry needling;Energy conservation;Splinting;Taping;Vasopneumatic Device    Consulted and Agree with Plan of Care  Patient       Patient will benefit from skilled therapeutic intervention in order to improve the following deficits and impairments:  Decreased range of motion, Impaired UE functional use, Increased muscle spasms, Decreased activity tolerance, Pain, Improper body mechanics, Impaired flexibility, Decreased scar mobility, Decreased strength, Increased edema, Postural dysfunction  Visit Diagnosis: Acute pain of right shoulder  Stiffness of right shoulder, not elsewhere classified  Muscle weakness (generalized)  Other symptoms and signs involving the musculoskeletal system     Problem List Patient Active Problem List   Diagnosis Date Noted  . Chronic right shoulder pain 05/06/2018  . Closed displaced trimalleolar fracture of lower leg with routine healing 02/17/2018  . Lumbar stenosis with neurogenic claudication 01/07/2016  . Lumbar pseudoarthrosis 07/06/2014   Bess Harvest, PTA 08/11/18 1:36 PM    [facility] [address]  [place], [place], [address] Phone: [phone]   Fax:  [phone]  Name: Angela Cook MRN: [id number] Date of Birth: 16-Sep-1955

## 2018-08-15 ENCOUNTER — Ambulatory Visit: Payer: 59

## 2018-08-15 DIAGNOSIS — M25511 Pain in right shoulder: Secondary | ICD-10-CM

## 2018-08-15 DIAGNOSIS — M25611 Stiffness of right shoulder, not elsewhere classified: Secondary | ICD-10-CM

## 2018-08-15 DIAGNOSIS — R29898 Other symptoms and signs involving the musculoskeletal system: Secondary | ICD-10-CM

## 2018-08-15 DIAGNOSIS — M6281 Muscle weakness (generalized): Secondary | ICD-10-CM

## 2018-08-15 NOTE — Therapy (Signed)
[facility] [address]  [place], [place], [address] Phone: [phone]   Fax:  [phone]  Physical Therapy Treatment  Patient Details  Name: Angela Cook MRN: [id number] Date of Birth: Apr 29, 1956 Referring Provider (PT): Frankey Shown, MD   Encounter Date: 08/15/2018  PT End of Session - 08/15/18 1712    Visit Number  5    Number of Visits  17    Date for PT Re-Evaluation  09/21/18    Authorization Type  BCBS    PT Start Time  7793   pt. arrived late    PT Stop Time  1750    PT Time Calculation (min)  41 min    Activity Tolerance  Patient tolerated treatment well    Behavior During Therapy  [facility] for tasks assessed/performed       Past Medical History:  Diagnosis Date  . Arthritis   . Asthma    as a child, no issues since age 88  . Constipation due to opioid therapy   . Fibromyalgia    legs from back   . Pneumonia    as a child    Past Surgical History:  Procedure Laterality Date  . BACK SURGERY  07/05/13, 2015   lumbar 4-5, 2015- new bone graft inserted  . BUNIONECTOMY Left 2014  . CARPAL TUNNEL RELEASE Right 1986   left 1991  . CARPAL TUNNEL RELEASE Left   . CESAREAN SECTION  [phone]  . COLONOSCOPY    . LUMBAR LAMINECTOMY/DECOMPRESSION MICRODISCECTOMY Bilateral 01/07/2016   Procedure: DECOMPRESSIONL LUMBAR THREE-FOUR  WITH FORAMINOTOMIES ;  Surgeon: Kristeen Miss, MD;  Location: [facility];  Service: Neurosurgery;  Laterality: Bilateral;  . ORIF ANKLE FRACTURE Left 02/07/2018   Procedure: OPEN REDUCTION INTERNAL FIXATION (ORIF) LEFT BIMALLEOLAR ANKLE FRACTURE;  Surgeon: Leandrew Koyanagi, MD;  Location: [facility];  Service: Orthopedics;  Laterality: Left;  . SPINAL CORD STIMULATOR IMPLANT    . TUBAL LIGATION  1986    There were no vitals filed for this visit.  Subjective Assessment - 08/15/18 1711    Subjective  Doing well today.      Pertinent History   L4-5 MIS TLIF with iliac crest bone  graft 07/05/2013; Revision of posterior interbody fusion 07/06/14; Bil laminotomies and foraminotomies L3-L4 with decompression of L3 and L4 nerve roots 01/07/16; Thoracic laminectomies with placement of implanted spinal stimulator 05/25/17; L ankle trimalleolar ankle fracture s/p ORIF on 02/07/18     Diagnostic tests  Per MD- R shoulder US showed  full thickness mildly retracted supraspinatus tear & biceps tenosynovitis    Patient Stated Goals  relatively low pain levels    Currently in Pain?  No/denies    Pain Score  0-No pain   up to 4-5/10 at worst with reaching activities    Multiple Pain Sites  No                       [facility] Adult PT Treatment/Exercise - 08/15/18 1721      Elbow Exercises   Elbow Flexion  Right;10 reps;Standing;Theraband   yellow band      Shoulder Exercises: Supine   Protraction  Right;20 reps;Weights    Protraction Weight (lbs)  1    Flexion  Right;AROM;10 reps    Other Supine Exercises  Supine R shoulder circles CW, CCW 2# x 15 reps      Shoulder Exercises: Sidelying   External Rotation  Right;15 reps;Strengthening;AROM    External Rotation Weight (lbs)  1    External Rotation Limitations  Cues required for proper scapular retraction     Flexion  Right;10 reps;AROM    ABduction  Right;15 reps    ABduction Limitations  0-110 dg       Shoulder Exercises: Standing   Row  15 reps;Theraband    Theraband Level (Shoulder Row)  Level 2 (Red)    Row Limitations  Cues required for scapular retraction       Shoulder Exercises: Pulleys   Flexion  3 minutes    Flexion Limitations  to tolerance    Scaption  3 minutes    Scaption Limitations  to tolerance      Shoulder Exercises: ROM/Strengthening   UBE (Upper Arm Bike)  Lvl 1.0, 1 min each way    tolerated well    Lat Pull  15 reps    Lat Pull Limitations  10# - mid grip     Cybex Row  15 reps    Cybex Row Limitations  10# - low row     Wall Pushups  15 reps    Wall Pushups Limitations  gentle  well tolerated             PT Education - 08/15/18 1754    Education Details  HEP update; row, extension red band rows     Person(s) Educated  Patient    Methods  Explanation;Demonstration;Verbal cues;Handout    Comprehension  Verbalized understanding;Returned demonstration;Verbal cues required;Need further instruction       PT Short Term Goals - 08/03/18 0857      PT SHORT TERM GOAL #1   Title  Independent with initial HEP    Time  8    Period  Weeks    Status  Achieved        PT Long Term Goals - 08/03/18 0857      PT LONG TERM GOAL #1   Title  Patient to be independent with advanced HEP.    Time  8    Period  Weeks    Status  Partially Met      PT LONG TERM GOAL #2   Title  Patient to demonstrate >=4+/5 strength in R shoulder.     Time  8    Period  Weeks    Status  On-going      PT LONG TERM GOAL #3   Title  Pt will report ability to complete work tasks on computer inlcuding use of mouse with pain </= 3/10    Time  8    Period  Weeks    Status  Achieved      PT LONG TERM GOAL #4   Title  Patient to demonstrate R shoulder AROM/PROM WFL and pain free.     Time  8    Period  Weeks    Status  On-going      PT LONG TERM GOAL #5   Title  Patient to perform overhead reach with 5lbs with good scapular mechanics.     Time  8    Period  Weeks    Status  On-going            Plan - 08/15/18 1713    Clinical Impression Statement  Vania progressing well with gentle scapular/RTC strengthening activities.  Reports improving function of R shoulder and tolerated progression to AROM yellow TB resisted IR, ER activities well today.  scapular Mechanics appear to  be improving with reaching activities demonstrating progress toward LTG #5.  Ended visit pain free and updated HEP.  Will monitor response in upcoming visit.      Clinical Impairments Affecting Rehab Potential   L4-5 MIS TLIF with iliac crest bone graft 07/05/2013; Revision of posterior interbody fusion  07/06/14; Bil laminotomies and foraminotomies L3-L4 with decompression of L3 and L4 nerve roots 01/07/16; Thoracic laminectomies with placement of implanted spinal stimulator 05/25/17; L ankle trimalleolar ankle fracture s/p ORIF on 02/07/18     PT Frequency  2x / week    PT Duration  8 weeks    PT Treatment/Interventions  ADLs/Self Care Home Management;Cryotherapy;Moist Heat;Therapeutic activities;Therapeutic exercise;Neuromuscular re-education;Patient/family education;Passive range of motion;Scar mobilization;Manual techniques;Dry needling;Energy conservation;Splinting;Taping;Vasopneumatic Device    PT Next Visit Plan  progress AROM and RTC strengthening, gentle biceps strengthening     Consulted and Agree with Plan of Care  Patient       Patient will benefit from skilled therapeutic intervention in order to improve the following deficits and impairments:  Decreased range of motion, Impaired UE functional use, Increased muscle spasms, Decreased activity tolerance, Pain, Improper body mechanics, Impaired flexibility, Decreased scar mobility, Decreased strength, Increased edema, Postural dysfunction  Visit Diagnosis: Acute pain of right shoulder  Stiffness of right shoulder, not elsewhere classified  Muscle weakness (generalized)  Other symptoms and signs involving the musculoskeletal system     Problem List Patient Active Problem List   Diagnosis Date Noted  . Chronic right shoulder pain 05/06/2018  . Closed displaced trimalleolar fracture of lower leg with routine healing 02/17/2018  . Lumbar stenosis with neurogenic claudication 01/07/2016  . Lumbar pseudoarthrosis 07/06/2014    Bess Harvest, PTA 08/15/18 6:09 PM   [facility] [address]  [place], [place], [address] Phone: [phone]   Fax:  [phone]  Name: MINKA KNIGHT MRN: [id number] Date of Birth: [birth year]

## 2018-08-18 ENCOUNTER — Encounter: Payer: Self-pay | Admitting: Physical Therapy

## 2018-08-18 ENCOUNTER — Ambulatory Visit: Payer: 59 | Admitting: Physical Therapy

## 2018-08-18 DIAGNOSIS — M25511 Pain in right shoulder: Secondary | ICD-10-CM

## 2018-08-18 DIAGNOSIS — M6281 Muscle weakness (generalized): Secondary | ICD-10-CM

## 2018-08-18 DIAGNOSIS — R29898 Other symptoms and signs involving the musculoskeletal system: Secondary | ICD-10-CM

## 2018-08-18 DIAGNOSIS — M25611 Stiffness of right shoulder, not elsewhere classified: Secondary | ICD-10-CM

## 2018-08-18 NOTE — Therapy (Signed)
[facility] [address]  [place], [place], [address] Phone: [phone]   Fax:  [phone]  Physical Therapy Treatment  Patient Details  Name: Angela Cook MRN: [id number] Date of Birth: 05-23-1956 Referring Provider (PT): Frankey Shown, MD   Encounter Date: 08/18/2018  PT End of Session - 08/18/18 1013    Visit Number  6    Number of Visits  17    Date for PT Re-Evaluation  09/21/18    Authorization Type  BCBS    PT Start Time  0932    PT Stop Time  1013    PT Time Calculation (min)  41 min    Activity Tolerance  Patient tolerated treatment well    Behavior During Therapy  [facility] for tasks assessed/performed       Past Medical History:  Diagnosis Date  . Arthritis   . Asthma    as a child, no issues since age 37  . Constipation due to opioid therapy   . Fibromyalgia    legs from back   . Pneumonia    as a child    Past Surgical History:  Procedure Laterality Date  . BACK SURGERY  07/05/13, 2015   lumbar 4-5, 2015- new bone graft inserted  . BUNIONECTOMY Left 2014  . CARPAL TUNNEL RELEASE Right 1986   left 1991  . CARPAL TUNNEL RELEASE Left   . CESAREAN SECTION  [phone]  . COLONOSCOPY    . LUMBAR LAMINECTOMY/DECOMPRESSION MICRODISCECTOMY Bilateral 01/07/2016   Procedure: DECOMPRESSIONL LUMBAR THREE-FOUR  WITH FORAMINOTOMIES ;  Surgeon: Kristeen Miss, MD;  Location: [facility];  Service: Neurosurgery;  Laterality: Bilateral;  . ORIF ANKLE FRACTURE Left 02/07/2018   Procedure: OPEN REDUCTION INTERNAL FIXATION (ORIF) LEFT BIMALLEOLAR ANKLE FRACTURE;  Surgeon: Leandrew Koyanagi, MD;  Location: [facility];  Service: Orthopedics;  Laterality: Left;  . SPINAL CORD STIMULATOR IMPLANT    . TUBAL LIGATION  1986    There were no vitals filed for this visit.  Subjective Assessment - 08/18/18 0933    Subjective  Reports that she did the bike, treadmill, and elliptical which hurt her back and hips. Tried sleeping in  her bed which caused pain, planning to return to her recliner.     Pertinent History   L4-5 MIS TLIF with iliac crest bone graft 07/05/2013; Revision of posterior interbody fusion 07/06/14; Bil laminotomies and foraminotomies L3-L4 with decompression of L3 and L4 nerve roots 01/07/16; Thoracic laminectomies with placement of implanted spinal stimulator 05/25/17; L ankle trimalleolar ankle fracture s/p ORIF on 02/07/18     Diagnostic tests  Per MD- R shoulder US showed  full thickness mildly retracted supraspinatus tear & biceps tenosynovitis    Patient Stated Goals  relatively low pain levels    Currently in Pain?  Yes    Pain Score  2     Pain Location  Shoulder    Pain Orientation  Right;Anterior    Pain Descriptors / Indicators  Aching    Pain Type  Acute pain;Surgical pain                       [facility] Adult PT Treatment/Exercise - 08/18/18 0001      Shoulder Exercises: Supine   Protraction  Strengthening;Both;15 reps;Weights    Protraction Weight (lbs)  2    Protraction Limitations  2x15; cues to maintain elbows straight      Shoulder Exercises: Seated  Other Seated Exercises  R bicep curl x10 yellow TB with cues for decreased speed on return      Shoulder Exercises: Sidelying   External Rotation  Strengthening;AROM;Right;10 reps    External Rotation Weight (lbs)  2    External Rotation Limitations  2x10; with dowel; cues to stop at neutral    ABduction  Strengthening;Right;10 reps    ABduction Weight (lbs)  1    ABduction Limitations  thumb up; to tolerance      Shoulder Exercises: Standing   External Rotation  Right;10 reps;Theraband;Strengthening    Theraband Level (Shoulder External Rotation)  Level 1 (Yellow)    External Rotation Limitations  isometric step outs    good form   Internal Rotation  Right;10 reps;Theraband;Strengthening    Internal Rotation Limitations  isometric step outs   good form   Extension  Both;10 reps;Strengthening;Theraband     Theraband Level (Shoulder Extension)  Level 2 (Red)    Extension Limitations  cues for proper line of pull    Row  15 reps;Theraband    Theraband Level (Shoulder Row)  Level 2 (Red)    Row Limitations  good form      Shoulder Exercises: ROM/Strengthening   UBE (Upper Arm Bike)  Lvl 1.0, 3 min forward/3 min back      Shoulder Exercises: Stretch   Other Shoulder Stretches  R & L LS stretch 30"    Other Shoulder Stretches  R & L UT stretch 30"      Manual Therapy   Manual Therapy  Soft tissue mobilization;Myofascial release    Soft tissue mobilization  STM to R UT and LS- tender throughout    Myofascial Release  TRP to R UT             PT Education - 08/18/18 1012    Education Details  update to HEP; administered yellow TB    Person(s) Educated  Patient    Methods  Explanation;Demonstration;Tactile cues;Verbal cues;Handout    Comprehension  Verbalized understanding;Returned demonstration       PT Short Term Goals - 08/03/18 0857      PT SHORT TERM GOAL #1   Title  Independent with initial HEP    Time  8    Period  Weeks    Status  Achieved        PT Long Term Goals - 08/03/18 0857      PT LONG TERM GOAL #1   Title  Patient to be independent with advanced HEP.    Time  8    Period  Weeks    Status  Partially Met      PT LONG TERM GOAL #2   Title  Patient to demonstrate >=4+/5 strength in R shoulder.     Time  8    Period  Weeks    Status  On-going      PT LONG TERM GOAL #3   Title  Pt will report ability to complete work tasks on computer inlcuding use of mouse with pain </= 3/10    Time  8    Period  Weeks    Status  Achieved      PT LONG TERM GOAL #4   Title  Patient to demonstrate R shoulder AROM/PROM WFL and pain free.     Time  8    Period  Weeks    Status  On-going      PT LONG TERM GOAL #5   Title  Patient  to perform overhead reach with 5lbs with good scapular mechanics.     Time  8    Period  Weeks    Status  On-going             Plan - 08/18/18 1013    Clinical Impression Statement  Patient arrived to session with report of going to the gym an attempting bike, treadmill, and elliptical which bothered her back and hip. Able to progress serratus punches with cues to maintain elbows straight. Also tolerated increased weight to sidelying abduction and ER today with some cues for scapular retraction but good form overall. Treated patient's report of R sided UT tightness with STM with patient reporting good relief. Followed with UT and LS stretching for continued relief. Good tolerance of RTC strengthening at end of session. Patient with report of improvement in symptoms at end of session.     Clinical Impairments Affecting Rehab Potential   L4-5 MIS TLIF with iliac crest bone graft 07/05/2013; Revision of posterior interbody fusion 07/06/14; Bil laminotomies and foraminotomies L3-L4 with decompression of L3 and L4 nerve roots 01/07/16; Thoracic laminectomies with placement of implanted spinal stimulator 05/25/17; L ankle trimalleolar ankle fracture s/p ORIF on 02/07/18     PT Treatment/Interventions  ADLs/Self Care Home Management;Cryotherapy;Moist Heat;Therapeutic activities;Therapeutic exercise;Neuromuscular re-education;Patient/family education;Passive range of motion;Scar mobilization;Manual techniques;Dry needling;Energy conservation;Splinting;Taping;Vasopneumatic Device    PT Next Visit Plan  progress AROM and RTC strengthening, gentle biceps strengthening     Consulted and Agree with Plan of Care  Patient       Patient will benefit from skilled therapeutic intervention in order to improve the following deficits and impairments:  Decreased range of motion, Impaired UE functional use, Increased muscle spasms, Decreased activity tolerance, Pain, Improper body mechanics, Impaired flexibility, Decreased scar mobility, Decreased strength, Increased edema, Postural dysfunction  Visit Diagnosis: Acute pain of right  shoulder  Stiffness of right shoulder, not elsewhere classified  Muscle weakness (generalized)  Other symptoms and signs involving the musculoskeletal system     Problem List Patient Active Problem List   Diagnosis Date Noted  . Chronic right shoulder pain 05/06/2018  . Closed displaced trimalleolar fracture of lower leg with routine healing 02/17/2018  . Lumbar stenosis with neurogenic claudication 01/07/2016  . Lumbar pseudoarthrosis 07/06/2014   Janene Harvey, PT, DPT 08/18/18 10:19 AM   [facility] [address]  [place], [place], [address] Phone: [phone]   Fax:  [phone]  Name: LANYLA COSTELLO MRN: [id number] Date of Birth: [birth year]

## 2018-08-22 ENCOUNTER — Ambulatory Visit: Payer: 59 | Attending: Orthopaedic Surgery | Admitting: Physical Therapy

## 2018-08-22 ENCOUNTER — Encounter: Payer: Self-pay | Admitting: Physical Therapy

## 2018-08-22 DIAGNOSIS — M25611 Stiffness of right shoulder, not elsewhere classified: Secondary | ICD-10-CM | POA: Insufficient documentation

## 2018-08-22 DIAGNOSIS — M25511 Pain in right shoulder: Secondary | ICD-10-CM | POA: Diagnosis present

## 2018-08-22 DIAGNOSIS — R29898 Other symptoms and signs involving the musculoskeletal system: Secondary | ICD-10-CM | POA: Insufficient documentation

## 2018-08-22 DIAGNOSIS — M6281 Muscle weakness (generalized): Secondary | ICD-10-CM | POA: Insufficient documentation

## 2018-08-22 NOTE — Therapy (Signed)
[facility] [address]  [place], [place], [address] Phone: [phone]   Fax:  [phone]  Physical Therapy Treatment  Patient Details  Name: Angela Cook MRN: [id number] Date of Birth: 03/07/1956 Referring Provider (PT): Frankey Shown, MD   Encounter Date: 08/22/2018  PT End of Session - 08/22/18 1751    Visit Number  7    Number of Visits  17    Date for PT Re-Evaluation  09/21/18    Authorization Type  BCBS    PT Start Time  1701    PT Stop Time  1741    PT Time Calculation (min)  40 min    Activity Tolerance  Patient tolerated treatment well    Behavior During Therapy  [facility] for tasks assessed/performed       Past Medical History:  Diagnosis Date  . Arthritis   . Asthma    as a child, no issues since age 50  . Constipation due to opioid therapy   . Fibromyalgia    legs from back   . Pneumonia    as a child    Past Surgical History:  Procedure Laterality Date  . BACK SURGERY  07/05/13, 2015   lumbar 4-5, 2015- new bone graft inserted  . BUNIONECTOMY Left 2014  . CARPAL TUNNEL RELEASE Right 1986   left 1991  . CARPAL TUNNEL RELEASE Left   . CESAREAN SECTION  [phone]  . COLONOSCOPY    . LUMBAR LAMINECTOMY/DECOMPRESSION MICRODISCECTOMY Bilateral 01/07/2016   Procedure: DECOMPRESSIONL LUMBAR THREE-FOUR  WITH FORAMINOTOMIES ;  Surgeon: Kristeen Miss, MD;  Location: [facility];  Service: Neurosurgery;  Laterality: Bilateral;  . ORIF ANKLE FRACTURE Left 02/07/2018   Procedure: OPEN REDUCTION INTERNAL FIXATION (ORIF) LEFT BIMALLEOLAR ANKLE FRACTURE;  Surgeon: Leandrew Koyanagi, MD;  Location: [facility];  Service: Orthopedics;  Laterality: Left;  . SPINAL CORD STIMULATOR IMPLANT    . TUBAL LIGATION  1986    There were no vitals filed for this visit.  Subjective Assessment - 08/22/18 1703    Subjective  Reports that she is doing good- feels a bit better on the weekends. Has been avoiding lifting heavy  objects.     Pertinent History   L4-5 MIS TLIF with iliac crest bone graft 07/05/2013; Revision of posterior interbody fusion 07/06/14; Bil laminotomies and foraminotomies L3-L4 with decompression of L3 and L4 nerve roots 01/07/16; Thoracic laminectomies with placement of implanted spinal stimulator 05/25/17; L ankle trimalleolar ankle fracture s/p ORIF on 02/07/18     Diagnostic tests  Per MD- R shoulder US showed  full thickness mildly retracted supraspinatus tear & biceps tenosynovitis    Patient Stated Goals  relatively low pain levels    Currently in Pain?  No/denies         [facility] PT Assessment - 08/22/18 0001      AROM   Right Shoulder Flexion  143 Degrees    Right Shoulder ABduction  145 Degrees    Right Shoulder Internal Rotation  --   FIR T10; c/o pain   Right Shoulder External Rotation  --   FER T2   Left Shoulder Flexion  --    Left Shoulder ABduction  --    Left Shoulder Internal Rotation  --    Left Shoulder External Rotation  --      PROM   Right Shoulder Flexion  153 Degrees    Right Shoulder ABduction  161 Degrees  Right Shoulder Internal Rotation  83 Degrees    Right Shoulder External Rotation  85 Degrees      Strength   Right Shoulder Flexion  4/5    Right Shoulder ABduction  4/5    Right Shoulder Internal Rotation  4/5    Right Shoulder External Rotation  3+/5                   [facility] Adult PT Treatment/Exercise - 08/22/18 0001      Shoulder Exercises: Seated   Flexion  AAROM;Right;10 reps    Flexion Limitations  flexion rollout with orange pball 10x3"    Abduction  Strengthening;Right;10 reps;Weights    ABduction Weight (lbs)  1    ABduction Limitations  cues to avoid straining and shoulder hiking    Other Seated Exercises  R shoulder flexion with 1# x10   advised to avoid straining   Other Seated Exercises  R shoulder scaption with 1#       Shoulder Exercises: Standing   External Rotation  Right;10 reps;Theraband;Strengthening     Theraband Level (Shoulder External Rotation)  Level 1 (Yellow)    External Rotation Weight (lbs)  cues to maintain elbow in at side    Internal Rotation  Right;10 reps;Theraband;Strengthening    Theraband Level (Shoulder Internal Rotation)  Level 1 (Yellow)    Internal Rotation Limitations  cues to maintain elbow in at side      Shoulder Exercises: ROM/Strengthening   UBE (Upper Arm Bike)  Lvl 1.0, 3 min forward/3 min back      Manual Therapy   Passive ROM  R shoulder PROM all directions to tolerance with slight distraction for comfort             PT Education - 08/22/18 1750    Education Details  update and consolidation of HEP    Person(s) Educated  Patient    Methods  Explanation;Demonstration;Tactile cues;Verbal cues;Handout    Comprehension  Verbalized understanding;Returned demonstration       PT Short Term Goals - 08/22/18 1706      PT SHORT TERM GOAL #1   Title  Independent with initial HEP    Time  8    Period  Weeks    Status  Achieved        PT Long Term Goals - 08/22/18 1707      PT LONG TERM GOAL #1   Title  Patient to be independent with advanced HEP.    Time  8    Period  Weeks    Status  Partially Met   met for current     PT LONG TERM GOAL #2   Title  Patient to demonstrate >=4+/5 strength in R shoulder.     Time  8    Period  Weeks    Status  On-going   improved in R shoulder flexion, abduction, IR strength     PT LONG TERM GOAL #3   Title  Pt will report ability to complete work tasks on computer inlcuding use of mouse with pain </= 3/10    Time  8    Period  Weeks    Status  Partially Met   reports on average 5/10 pain with using mouse     PT LONG TERM GOAL #4   Title  Patient to demonstrate R shoulder AROM/PROM WFL and pain free.     Time  8    Period  Weeks    Status  On-going  improvements demonstrated in R shoulder flexion, abduction, IR AROM and flexion and abduction PROM     PT LONG TERM GOAL #5   Title  Patient to  perform overhead reach with 5lbs with good scapular mechanics.     Time  8    Period  Weeks    Status  On-going   able to reach overhead with 1# and shoulder hiking at end range           Plan - 08/22/18 1751    Clinical Impression Statement  Patient arrived to session with no new complaints. Continues to avoid overhead movement with heavy weight. Patient has shown improvements in R shoulder flexion, abduction, IR AROM and flexion and abduction PROM since initial measurements. Also showing improved in R shoulder flexion, abduction, IR strength. Still most limited in ER strength. Patient able to reach overhead with 1# and shoulder hiking as compensation at end range. Reports 5/10 pain in R shoulder on average with using mouse at home at this time. Worked on progressive RTC strengthening this session with heavy cueing to avoid straining and shoulder hiking with overhead movement with light resistance. Patient tolerated IR/ER with light banded resistance with good form. Took time to consolidate and update HEP to address remaining impairments. Patient without pain at end of session.     Clinical Impairments Affecting Rehab Potential   L4-5 MIS TLIF with iliac crest bone graft 07/05/2013; Revision of posterior interbody fusion 07/06/14; Bil laminotomies and foraminotomies L3-L4 with decompression of L3 and L4 nerve roots 01/07/16; Thoracic laminectomies with placement of implanted spinal stimulator 05/25/17; L ankle trimalleolar ankle fracture s/p ORIF on 02/07/18     PT Treatment/Interventions  ADLs/Self Care Home Management;Cryotherapy;Moist Heat;Therapeutic activities;Therapeutic exercise;Neuromuscular re-education;Patient/family education;Passive range of motion;Scar mobilization;Manual techniques;Dry needling;Energy conservation;Splinting;Taping;Vasopneumatic Device    PT Next Visit Plan  progress AROM and RTC strengthening, gentle biceps strengthening     Consulted and Agree with Plan of Care   Patient       Patient will benefit from skilled therapeutic intervention in order to improve the following deficits and impairments:  Decreased range of motion, Impaired UE functional use, Increased muscle spasms, Decreased activity tolerance, Pain, Improper body mechanics, Impaired flexibility, Decreased scar mobility, Decreased strength, Increased edema, Postural dysfunction  Visit Diagnosis: Acute pain of right shoulder  Stiffness of right shoulder, not elsewhere classified  Muscle weakness (generalized)  Other symptoms and signs involving the musculoskeletal system     Problem List Patient Active Problem List   Diagnosis Date Noted  . Chronic right shoulder pain 05/06/2018  . Closed displaced trimalleolar fracture of lower leg with routine healing 02/17/2018  . Lumbar stenosis with neurogenic claudication 01/07/2016  . Lumbar pseudoarthrosis 07/06/2014    Janene Harvey, PT, DPT 08/22/18 5:59 PM   [facility] [address]  [place], [place], [address] Phone: [phone]   Fax:  [phone]  Name: Angela Cook MRN: [id number] Date of Birth: [birth year]

## 2018-08-25 ENCOUNTER — Encounter (INDEPENDENT_AMBULATORY_CARE_PROVIDER_SITE_OTHER): Payer: Self-pay | Admitting: Orthopaedic Surgery

## 2018-08-25 ENCOUNTER — Ambulatory Visit: Payer: 59 | Admitting: Physical Therapy

## 2018-08-25 ENCOUNTER — Encounter: Payer: Self-pay | Admitting: Physical Therapy

## 2018-08-25 ENCOUNTER — Ambulatory Visit (INDEPENDENT_AMBULATORY_CARE_PROVIDER_SITE_OTHER): Payer: 59 | Admitting: Orthopaedic Surgery

## 2018-08-25 VITALS — Ht 61.0 in | Wt 150.0 lb

## 2018-08-25 DIAGNOSIS — M6281 Muscle weakness (generalized): Secondary | ICD-10-CM

## 2018-08-25 DIAGNOSIS — M25511 Pain in right shoulder: Secondary | ICD-10-CM | POA: Diagnosis not present

## 2018-08-25 DIAGNOSIS — R29898 Other symptoms and signs involving the musculoskeletal system: Secondary | ICD-10-CM

## 2018-08-25 DIAGNOSIS — M25611 Stiffness of right shoulder, not elsewhere classified: Secondary | ICD-10-CM

## 2018-08-25 DIAGNOSIS — Z9889 Other specified postprocedural states: Secondary | ICD-10-CM

## 2018-08-25 NOTE — Progress Notes (Signed)
   Post-Op Visit Note   Patient: Angela Cook           Date of Birth: [birth year]           MRN: [id number] Visit Date: 08/25/2018 PCP: Deloris Ping, MD   Assessment & Plan:  Chief Complaint:  Chief Complaint  Patient presents with  . Right Shoulder - Follow-up    Status post arthroscopy of right shoulder      Visit Diagnoses:  1. Status post arthroscopy of right shoulder     Plan: [name] is 6 to 7 weeks status post right shoulder arthroscopy with debridement.  She is making good progress with outpatient physical therapy.  Her pain is at worst 5 out of 10.  She is working on Psychologist, prison and probation services which is the most bothersome as expected.  Her surgical scars are fully healed.  She has excellent passive and active range of motion.  Her strength with empty can testing is coming along.  Overall she is improving.  At this point I recommend continuing with outpatient physical therapy per the physical therapist discretion.  I am happy to extend her outpatient physical therapy if needed.  I would like to recheck her in 6 weeks.  Questions encouraged and answered.  Follow-Up Instructions: Return in about 6 weeks (around 10/06/2018).   Orders:  No orders of the defined types were placed in this encounter.  No orders of the defined types were placed in this encounter.   Imaging: No results found.  PMFS History: Patient Active Problem List   Diagnosis Date Noted  . Chronic right shoulder pain 05/06/2018  . Closed displaced trimalleolar fracture of lower leg with routine healing 02/17/2018  . Lumbar stenosis with neurogenic claudication 01/07/2016  . Lumbar pseudoarthrosis 07/06/2014   Past Medical History:  Diagnosis Date  . Arthritis   . Asthma    as a child, no issues since age 48  . Constipation due to opioid therapy   . Fibromyalgia    legs from back   . Pneumonia    as a child    No family history on file.  Past Surgical History:  Procedure Laterality Date  . BACK  SURGERY  07/05/13, 2015   lumbar 4-5, 2015- new bone graft inserted  . BUNIONECTOMY Left 2014  . CARPAL TUNNEL RELEASE Right 1986   left 1991  . CARPAL TUNNEL RELEASE Left   . CESAREAN SECTION  [phone]  . COLONOSCOPY    . LUMBAR LAMINECTOMY/DECOMPRESSION MICRODISCECTOMY Bilateral 01/07/2016   Procedure: DECOMPRESSIONL LUMBAR THREE-FOUR  WITH FORAMINOTOMIES ;  Surgeon: Barnett Abu, MD;  Location: [facility];  Service: Neurosurgery;  Laterality: Bilateral;  . ORIF ANKLE FRACTURE Left 02/07/2018   Procedure: OPEN REDUCTION INTERNAL FIXATION (ORIF) LEFT BIMALLEOLAR ANKLE FRACTURE;  Surgeon: Tarry Kos, MD;  Location: [facility];  Service: Orthopedics;  Laterality: Left;  . SPINAL CORD STIMULATOR IMPLANT    . TUBAL LIGATION  1986   Social History   Occupational History  . Not on file  Tobacco Use  . Smoking status: Never Smoker  . Smokeless tobacco: Never Used  Substance and Sexual Activity  . Alcohol use: No    Frequency: Never  . Drug use: No  . Sexual activity: Not on file

## 2018-08-25 NOTE — Therapy (Signed)
[facility] [address]  [place], [place], [address] Phone: [phone]   Fax:  [phone]  Physical Therapy Treatment  Patient Details  Name: Angela Cook MRN: [id number] Date of Birth: 04-21-1956 Referring Provider (PT): Frankey Shown, MD   Encounter Date: 08/25/2018  PT End of Session - 08/25/18 0959    Visit Number  8    Number of Visits  17    Date for PT Re-Evaluation  09/21/18    Authorization Type  BCBS    PT Start Time  0921    PT Stop Time  0959    PT Time Calculation (min)  38 min    Activity Tolerance  Patient tolerated treatment well    Behavior During Therapy  [facility] for tasks assessed/performed       Past Medical History:  Diagnosis Date  . Arthritis   . Asthma    as a child, no issues since age 54  . Constipation due to opioid therapy   . Fibromyalgia    legs from back   . Pneumonia    as a child    Past Surgical History:  Procedure Laterality Date  . BACK SURGERY  07/05/13, 2015   lumbar 4-5, 2015- new bone graft inserted  . BUNIONECTOMY Left 2014  . CARPAL TUNNEL RELEASE Right 1986   left 1991  . CARPAL TUNNEL RELEASE Left   . CESAREAN SECTION  [phone]  . COLONOSCOPY    . LUMBAR LAMINECTOMY/DECOMPRESSION MICRODISCECTOMY Bilateral 01/07/2016   Procedure: DECOMPRESSIONL LUMBAR THREE-FOUR  WITH FORAMINOTOMIES ;  Surgeon: Kristeen Miss, MD;  Location: [facility];  Service: Neurosurgery;  Laterality: Bilateral;  . ORIF ANKLE FRACTURE Left 02/07/2018   Procedure: OPEN REDUCTION INTERNAL FIXATION (ORIF) LEFT BIMALLEOLAR ANKLE FRACTURE;  Surgeon: Leandrew Koyanagi, MD;  Location: [facility];  Service: Orthopedics;  Laterality: Left;  . SPINAL CORD STIMULATOR IMPLANT    . TUBAL LIGATION  1986    There were no vitals filed for this visit.  Subjective Assessment - 08/25/18 0925    Subjective  Reports that she saw MD this AM who showed her how to safetly use her computer at work.     Pertinent  History   L4-5 MIS TLIF with iliac crest bone graft 07/05/2013; Revision of posterior interbody fusion 07/06/14; Bil laminotomies and foraminotomies L3-L4 with decompression of L3 and L4 nerve roots 01/07/16; Thoracic laminectomies with placement of implanted spinal stimulator 05/25/17; L ankle trimalleolar ankle fracture s/p ORIF on 02/07/18     Diagnostic tests  Per MD- R shoulder US showed  full thickness mildly retracted supraspinatus tear & biceps tenosynovitis    Patient Stated Goals  relatively low pain levels    Currently in Pain?  Yes    Pain Score  4     Pain Location  Neck    Pain Orientation  Right    Pain Descriptors / Indicators  Tightness    Pain Type  Acute pain                       [facility] Adult PT Treatment/Exercise - 08/25/18 0001      Elbow Exercises   Elbow Flexion  Right;10 reps;Theraband;Seated   yellow TB; 2x10 with cues to maintian elbow in at side     Shoulder Exercises: Seated   Flexion  AAROM;Right;10 reps    Flexion Limitations  flexion rollout with orange pball 10x3"  Abduction  Strengthening;Right;10 reps;Weights    ABduction Limitations  abduction rollout with orange pball 10x3"      Shoulder Exercises: Prone   Retraction  Strengthening;Right;15 reps    Retraction Weight (lbs)  2    Retraction Limitations  prone row over orange pball; 5x 0#, 10x 2#    Extension  Strengthening;Right;Weights;15 reps    Extension Weight (lbs)  1    Extension Limitations  prone extension over orange pball      Shoulder Exercises: Standing   External Rotation  Right;Theraband;Strengthening;15 reps   cues to stop at neutral   Theraband Level (Shoulder External Rotation)  Level 1 (Yellow)    External Rotation Weight (lbs)  cues to maintain elbow in at side and avoid rotating trunk    Internal Rotation  Right;Theraband;Strengthening;15 reps    Theraband Level (Shoulder Internal Rotation)  Level 1 (Yellow)    Internal Rotation Limitations  cues to stop at  neutral    Flexion  Right;10 reps;AAROM    Shoulder Flexion Weight (lbs)  1    Flexion Limitations  cues to avoid shoulder hiking and straining    ABduction  Strengthening;Right;10 reps;Weights    Shoulder ABduction Weight (lbs)  1    ABduction Limitations  cues to avoid hiking; limited ROM and mild pain at ~90 deg    Row  15 reps;Theraband    Theraband Level (Shoulder Row)  Level 2 (Red)    Row Limitations  cues to avoid shoulder hiking    Other Standing Exercises  R shoulder scaption with 1# x10   cues to avoid hiking     Shoulder Exercises: ROM/Strengthening   UBE (Upper Arm Bike)  Lvl 1.5, 3 min forward/3 min back             PT Education - 08/25/18 0958    Education Details  review of proper form with HEP    Person(s) Educated  Patient    Methods  Explanation;Demonstration;Tactile cues;Verbal cues    Comprehension  Verbalized understanding;Returned demonstration       PT Short Term Goals - 08/22/18 1706      PT SHORT TERM GOAL #1   Title  Independent with initial HEP    Time  8    Period  Weeks    Status  Achieved        PT Long Term Goals - 08/22/18 1707      PT LONG TERM GOAL #1   Title  Patient to be independent with advanced HEP.    Time  8    Period  Weeks    Status  Partially Met   met for current     PT LONG TERM GOAL #2   Title  Patient to demonstrate >=4+/5 strength in R shoulder.     Time  8    Period  Weeks    Status  On-going   improved in R shoulder flexion, abduction, IR strength     PT LONG TERM GOAL #3   Title  Pt will report ability to complete work tasks on computer inlcuding use of mouse with pain </= 3/10    Time  8    Period  Weeks    Status  Partially Met   reports on average 5/10 pain with using mouse     PT LONG TERM GOAL #4   Title  Patient to demonstrate R shoulder AROM/PROM WFL and pain free.     Time  8    Period  Weeks    Status  On-going   improvements demonstrated in R shoulder flexion, abduction, IR AROM and  flexion and abduction PROM     PT LONG TERM GOAL #5   Title  Patient to perform overhead reach with 5lbs with good scapular mechanics.     Time  8    Period  Weeks    Status  On-going   able to reach overhead with 1# and shoulder hiking at end range           Plan - 08/25/18 0959    Clinical Impression Statement  Patient reported that she saw MD this AM who was pleased with her progress and advised her to avoid heavy overhead lifting as well as reaching forward with computer work. Worked on gentle RTC and periscapular strengthening today with light resistance. Patient still requiring cues to avoid shoulder hiking and straining at end ranges of motion. More discomfort notes with abduction than other planes of motion. Patient reporting misunderstanding bicep curl exercise, demonstrating ER rather than elbow flexion- corrected patient to proper form with good carryover. Patient tolerated resisted shoulder IR/ER well today with cueing required to orient thumb up and avoid total body rotation. Patient without complaints at end of session.     Clinical Impairments Affecting Rehab Potential   L4-5 MIS TLIF with iliac crest bone graft 07/05/2013; Revision of posterior interbody fusion 07/06/14; Bil laminotomies and foraminotomies L3-L4 with decompression of L3 and L4 nerve roots 01/07/16; Thoracic laminectomies with placement of implanted spinal stimulator 05/25/17; L ankle trimalleolar ankle fracture s/p ORIF on 02/07/18     PT Treatment/Interventions  ADLs/Self Care Home Management;Cryotherapy;Moist Heat;Therapeutic activities;Therapeutic exercise;Neuromuscular re-education;Patient/family education;Passive range of motion;Scar mobilization;Manual techniques;Dry needling;Energy conservation;Splinting;Taping;Vasopneumatic Device    PT Next Visit Plan  progress AROM and RTC strengthening, gentle biceps strengthening     Consulted and Agree with Plan of Care  Patient       Patient will benefit from  skilled therapeutic intervention in order to improve the following deficits and impairments:  Decreased range of motion, Impaired UE functional use, Increased muscle spasms, Decreased activity tolerance, Pain, Improper body mechanics, Impaired flexibility, Decreased scar mobility, Decreased strength, Increased edema, Postural dysfunction  Visit Diagnosis: Acute pain of right shoulder  Stiffness of right shoulder, not elsewhere classified  Muscle weakness (generalized)  Other symptoms and signs involving the musculoskeletal system     Problem List Patient Active Problem List   Diagnosis Date Noted  . Chronic right shoulder pain 05/06/2018  . Closed displaced trimalleolar fracture of lower leg with routine healing 02/17/2018  . Lumbar stenosis with neurogenic claudication 01/07/2016  . Lumbar pseudoarthrosis 07/06/2014    Angela Cook, PT, DPT 08/25/18 10:04 AM   [facility] [address]  [place], [place], [address] Phone: [phone]   Fax:  [phone]  Name: Angela Cook MRN: [id number] Date of Birth: 02/28/1956

## 2018-08-29 ENCOUNTER — Encounter: Payer: Self-pay | Admitting: Physical Therapy

## 2018-08-29 ENCOUNTER — Ambulatory Visit: Payer: 59 | Admitting: Physical Therapy

## 2018-08-29 DIAGNOSIS — M25511 Pain in right shoulder: Secondary | ICD-10-CM

## 2018-08-29 DIAGNOSIS — R29898 Other symptoms and signs involving the musculoskeletal system: Secondary | ICD-10-CM

## 2018-08-29 DIAGNOSIS — M6281 Muscle weakness (generalized): Secondary | ICD-10-CM

## 2018-08-29 DIAGNOSIS — M25611 Stiffness of right shoulder, not elsewhere classified: Secondary | ICD-10-CM

## 2018-08-29 NOTE — Therapy (Signed)
[facility] [address]  [place], [place], [address] Phone: [phone]   Fax:  [phone]  Physical Therapy Treatment  Patient Details  Name: Angela Cook MRN: [id number] Date of Birth: 1955/08/15 Referring Provider (PT): Frankey Shown, MD   Encounter Date: 08/29/2018  PT End of Session - 08/29/18 1745    Visit Number  9    Number of Visits  17    Date for PT Re-Evaluation  09/21/18    Authorization Type  BCBS    PT Start Time  1704    PT Stop Time  1744    PT Time Calculation (min)  40 min    Activity Tolerance  Patient tolerated treatment well;Patient limited by pain    Behavior During Therapy  [facility] for tasks assessed/performed       Past Medical History:  Diagnosis Date  . Arthritis   . Asthma    as a child, no issues since age 63  . Constipation due to opioid therapy   . Fibromyalgia    legs from back   . Pneumonia    as a child    Past Surgical History:  Procedure Laterality Date  . BACK SURGERY  07/05/13, 2015   lumbar 4-5, 2015- new bone graft inserted  . BUNIONECTOMY Left 2014  . CARPAL TUNNEL RELEASE Right 1986   left 1991  . CARPAL TUNNEL RELEASE Left   . CESAREAN SECTION  [phone]  . COLONOSCOPY    . LUMBAR LAMINECTOMY/DECOMPRESSION MICRODISCECTOMY Bilateral 01/07/2016   Procedure: DECOMPRESSIONL LUMBAR THREE-FOUR  WITH FORAMINOTOMIES ;  Surgeon: Kristeen Miss, MD;  Location: [facility];  Service: Neurosurgery;  Laterality: Bilateral;  . ORIF ANKLE FRACTURE Left 02/07/2018   Procedure: OPEN REDUCTION INTERNAL FIXATION (ORIF) LEFT BIMALLEOLAR ANKLE FRACTURE;  Surgeon: Leandrew Koyanagi, MD;  Location: [facility];  Service: Orthopedics;  Laterality: Left;  . SPINAL CORD STIMULATOR IMPLANT    . TUBAL LIGATION  1986    There were no vitals filed for this visit.  Subjective Assessment - 08/29/18 1705    Subjective  Reports that she had trouble sleeping over the weekend from "a crazy day at  work" and sleeping in a different recliner.     Pertinent History   L4-5 MIS TLIF with iliac crest bone graft 07/05/2013; Revision of posterior interbody fusion 07/06/14; Bil laminotomies and foraminotomies L3-L4 with decompression of L3 and L4 nerve roots 01/07/16; Thoracic laminectomies with placement of implanted spinal stimulator 05/25/17; L ankle trimalleolar ankle fracture s/p ORIF on 02/07/18     Diagnostic tests  Per MD- R shoulder US showed  full thickness mildly retracted supraspinatus tear & biceps tenosynovitis    Patient Stated Goals  relatively low pain levels    Currently in Pain?  No/denies                       [facility] Adult PT Treatment/Exercise - 08/29/18 0001      Elbow Exercises   Elbow Flexion  Strengthening;Left;10 reps;Seated;Theraband   R bicep curl yellow TB x10; red TB x10     Shoulder Exercises: Seated   Horizontal ABduction  Strengthening;Both;10 reps;Theraband    Theraband Level (Shoulder Horizontal ABduction)  Level 1 (Yellow)    Horizontal ABduction Limitations  2x10; cues for scap retraction and straight elbows    External Rotation  Strengthening;Both;10 reps;Theraband    Theraband Level (Shoulder External Rotation)  Level 1 (Yellow)  External Rotation Limitations  2x10; cues to avoid jerking motion    Flexion  Strengthening;Right;10 reps;Weights    Flexion Weight (lbs)  2    Flexion Limitations  cues to avoid straining at end range    Abduction  Strengthening;Right;10 reps;Weights    ABduction Weight (lbs)  1    ABduction Limitations  limited range d/t discomfort   unable to tolerate 2#     Shoulder Exercises: Prone   Flexion  Strengthening;Right;10 reps    Flexion Limitations  prone over orange pball I's   cues to avoid straining   Horizontal ABduction 1  Strengthening;Right;10 reps    Horizontal ABduction 1 Limitations  prone over orange pball T's    Other Prone Exercises  R UE prone over orange pball Y's x10      Shoulder  Exercises: Standing   External Rotation  Right;Theraband;Strengthening;15 reps    Theraband Level (Shoulder External Rotation)  Level 2 (Red)    External Rotation Weight (lbs)  cues to stop at neutral and avoid total body rotation    Internal Rotation  Right;Theraband;Strengthening;15 reps    Theraband Level (Shoulder Internal Rotation)  Level 2 (Red)    Internal Rotation Limitations  cues to stop at neutral and avoid total body rotation      Shoulder Exercises: ROM/Strengthening   UBE (Upper Arm Bike)  Lvl 1.7, 3 min forward/3 min back      Shoulder Exercises: Stretch   Corner Stretch  2 reps;20 seconds    Corner Stretch Limitations  R UE 90/90 pec stretch in doorway             PT Education - 08/29/18 1745    Education Details  update to HEP; administered red TB    Person(s) Educated  Patient    Methods  Explanation;Demonstration;Tactile cues;Verbal cues;Handout    Comprehension  Verbalized understanding;Returned demonstration       PT Short Term Goals - 08/22/18 1706      PT SHORT TERM GOAL #1   Title  Independent with initial HEP    Time  8    Period  Weeks    Status  Achieved        PT Long Term Goals - 08/22/18 1707      PT LONG TERM GOAL #1   Title  Patient to be independent with advanced HEP.    Time  8    Period  Weeks    Status  Partially Met   met for current     PT LONG TERM GOAL #2   Title  Patient to demonstrate >=4+/5 strength in R shoulder.     Time  8    Period  Weeks    Status  On-going   improved in R shoulder flexion, abduction, IR strength     PT LONG TERM GOAL #3   Title  Pt will report ability to complete work tasks on computer inlcuding use of mouse with pain </= 3/10    Time  8    Period  Weeks    Status  Partially Met   reports on average 5/10 pain with using mouse     PT LONG TERM GOAL #4   Title  Patient to demonstrate R shoulder AROM/PROM WFL and pain free.     Time  8    Period  Weeks    Status  On-going    improvements demonstrated in R shoulder flexion, abduction, IR AROM and flexion and abduction PROM  PT LONG TERM GOAL #5   Title  Patient to perform overhead reach with 5lbs with good scapular mechanics.     Time  8    Period  Weeks    Status  On-going   able to reach overhead with 1# and shoulder hiking at end range           Plan - 08/29/18 1745    Clinical Impression Statement  Patient arrived to session with report of trouble sleeping over the weekend d/t change in sleeping surface and stressful work day. Able to progress bicep curl on R UE with increased banded resistance with good tolerance. Introduced horizontal abduction and ER with light banded resistance with cues for scapular retraction. Patient still requiring cues to avoids straining at end range flexion and scaption today with prone I's and Y's. Able to progress resisted IR/ER with red band with heavy cueing to correct form and to avoid total body rotation. Updated HEP with weighted shoulder abduction and increased resistance with bicep curl. Patient reported understanding and with no complaints at end of session. Patient progressing well towards goals.     Clinical Impairments Affecting Rehab Potential   L4-5 MIS TLIF with iliac crest bone graft 07/05/2013; Revision of posterior interbody fusion 07/06/14; Bil laminotomies and foraminotomies L3-L4 with decompression of L3 and L4 nerve roots 01/07/16; Thoracic laminectomies with placement of implanted spinal stimulator 05/25/17; L ankle trimalleolar ankle fracture s/p ORIF on 02/07/18     PT Treatment/Interventions  ADLs/Self Care Home Management;Cryotherapy;Moist Heat;Therapeutic activities;Therapeutic exercise;Neuromuscular re-education;Patient/family education;Passive range of motion;Scar mobilization;Manual techniques;Dry needling;Energy conservation;Splinting;Taping;Vasopneumatic Device    PT Next Visit Plan  progress AROM and RTC strengthening, gentle biceps strengthening      Consulted and Agree with Plan of Care  Patient       Patient will benefit from skilled therapeutic intervention in order to improve the following deficits and impairments:  Decreased range of motion, Impaired UE functional use, Increased muscle spasms, Decreased activity tolerance, Pain, Improper body mechanics, Impaired flexibility, Decreased scar mobility, Decreased strength, Increased edema, Postural dysfunction  Visit Diagnosis: Acute pain of right shoulder  Stiffness of right shoulder, not elsewhere classified  Muscle weakness (generalized)  Other symptoms and signs involving the musculoskeletal system     Problem List Patient Active Problem List   Diagnosis Date Noted  . Chronic right shoulder pain 05/06/2018  . Closed displaced trimalleolar fracture of lower leg with routine healing 02/17/2018  . Lumbar stenosis with neurogenic claudication 01/07/2016  . Lumbar pseudoarthrosis 07/06/2014    Janene Harvey, PT, DPT 08/29/18 5:48 PM   [facility] [address]  [address] [place], [place], [address] Phone: [phone]   Fax:  [phone]  Name: KILAH DRAHOS MRN: [id number] Date of Birth: Jan 18, 1956

## 2018-08-31 ENCOUNTER — Ambulatory Visit: Payer: 59

## 2018-08-31 DIAGNOSIS — M25511 Pain in right shoulder: Secondary | ICD-10-CM | POA: Diagnosis not present

## 2018-08-31 DIAGNOSIS — R29898 Other symptoms and signs involving the musculoskeletal system: Secondary | ICD-10-CM

## 2018-08-31 DIAGNOSIS — M6281 Muscle weakness (generalized): Secondary | ICD-10-CM

## 2018-08-31 DIAGNOSIS — M25611 Stiffness of right shoulder, not elsewhere classified: Secondary | ICD-10-CM

## 2018-08-31 NOTE — Therapy (Addendum)
[facility] [address]  [place], [place], [address] Phone: [phone]   Fax:  [phone]  Physical Therapy Treatment  Patient Details  Name: Angela Cook MRN: [id number] Date of Birth: [birth year] Referring Provider (PT): Frankey Shown, MD   Encounter Date: 08/31/2018  PT End of Session - 08/31/18 0855    Visit Number  10    Number of Visits  17    Date for PT Re-Evaluation  09/21/18    Authorization Type  BCBS    PT Start Time  0845    PT Stop Time  0925    PT Time Calculation (min)  40 min    Activity Tolerance  Patient tolerated treatment well;Patient limited by pain    Behavior During Therapy  [facility] for tasks assessed/performed       Past Medical History:  Diagnosis Date  . Arthritis   . Asthma    as a child, no issues since age 19  . Constipation due to opioid therapy   . Fibromyalgia    legs from back   . Pneumonia    as a child    Past Surgical History:  Procedure Laterality Date  . BACK SURGERY  07/05/13, 2015   lumbar 4-5, 2015- new bone graft inserted  . BUNIONECTOMY Left 2014  . CARPAL TUNNEL RELEASE Right 1986   left 1991  . CARPAL TUNNEL RELEASE Left   . CESAREAN SECTION  [phone]  . COLONOSCOPY    . LUMBAR LAMINECTOMY/DECOMPRESSION MICRODISCECTOMY Bilateral 01/07/2016   Procedure: DECOMPRESSIONL LUMBAR THREE-FOUR  WITH FORAMINOTOMIES ;  Surgeon: Kristeen Miss, MD;  Location: [facility];  Service: Neurosurgery;  Laterality: Bilateral;  . ORIF ANKLE FRACTURE Left 02/07/2018   Procedure: OPEN REDUCTION INTERNAL FIXATION (ORIF) LEFT BIMALLEOLAR ANKLE FRACTURE;  Surgeon: Leandrew Koyanagi, MD;  Location: [facility];  Service: Orthopedics;  Laterality: Left;  . SPINAL CORD STIMULATOR IMPLANT    . TUBAL LIGATION  1986    There were no vitals filed for this visit.  Subjective Assessment - 08/31/18 0854    Subjective  Pt. noting ~ 90% improvement in R shoulder functiona since starting therapy.   Pt. noting ongoing R shoulder pain with minimal improvement since starting therapy.      Pertinent History   L4-5 MIS TLIF with iliac crest bone graft 07/05/2013; Revision of posterior interbody fusion 07/06/14; Bil laminotomies and foraminotomies L3-L4 with decompression of L3 and L4 nerve roots 01/07/16; Thoracic laminectomies with placement of implanted spinal stimulator 05/25/17; L ankle trimalleolar ankle fracture s/p ORIF on 02/07/18     Diagnostic tests  Per MD- R shoulder US showed  full thickness mildly retracted supraspinatus tear & biceps tenosynovitis    Patient Stated Goals  relatively low pain levels    Currently in Pain?  Yes    Pain Score  0-No pain   4/10 at worst   Pain Location  Shoulder    Pain Orientation  Right    Pain Descriptors / Indicators  Tightness    Pain Type  Acute pain    Pain Onset  More than a month ago    Pain Frequency  Intermittent    Multiple Pain Sites  No         [facility] PT Assessment - 08/31/18 0856      AROM   Right Shoulder Flexion  143 Degrees    Right Shoulder ABduction  154 Degrees    Right Shoulder Internal  Rotation  --   FIR to T10   Right Shoulder External Rotation  --   FER to T3   Left Shoulder Flexion  160 Degrees    Left Shoulder ABduction  157 Degrees    Left Shoulder Internal Rotation  --   FIR to bra strap    Left Shoulder External Rotation  --   FER to T3     Strength   Right/Left Shoulder  Right;Left    Right Shoulder Flexion  4/5    Right Shoulder ABduction  4/5    Right Shoulder Internal Rotation  4/5    Right Shoulder External Rotation  4/5    Left Shoulder Flexion  4+/5    Left Shoulder ABduction  4+/5    Left Shoulder Internal Rotation  4+/5    Left Shoulder External Rotation  4+/5                   [facility] Adult PT Treatment/Exercise - 08/31/18 0001      Shoulder Exercises: Standing   Flexion  Both;10 reps;Weights   2 sets; 0-110 dg    Shoulder Flexion Weight (lbs)  1    Flexion Limitations   leaning on wall     ABduction  Both;10 reps;Strengthening;Weights   2 sets; 0-110 dg    Shoulder ABduction Weight (lbs)  1    ABduction Limitations  leaning on wall     Extension  Both;15 reps;Strengthening;Theraband    Theraband Level (Shoulder Extension)  Level 2 (Red)    Extension Limitations  Cues for scapular retraction     Row  Both;10 reps;Strengthening;Theraband    Theraband Level (Shoulder Row)  Level 3 (Green)    Row Limitations  tolerated well       Shoulder Exercises: ROM/Strengthening   UBE (Upper Arm Bike)  Lvl 1.5, 3 min forward/3 min back               PT Short Term Goals - 08/22/18 1706      PT SHORT TERM GOAL #1   Title  Independent with initial HEP    Time  8    Period  Weeks    Status  Achieved        PT Long Term Goals - 08/31/18 9390      PT LONG TERM GOAL #1   Title  Patient to be independent with advanced HEP.    Time  8    Period  Weeks    Status  Partially Met   met for current     PT LONG TERM GOAL #2   Title  Patient to demonstrate >=4+/5 strength in R shoulder.     Time  8    Period  Weeks    Status  On-going   08/31/18: improved in all groups grossly 4/5      PT LONG TERM GOAL #3   Title  Pt will report ability to complete work tasks on computer inlcuding use of mouse with pain </= 3/10    Time  8    Period  Weeks    Status  On-going   2.12/20: reports on average 5/10 pain with using mouse     PT LONG TERM GOAL #4   Title  Patient to demonstrate R shoulder AROM/PROM WFL and pain free.     Time  8    Period  Weeks    Status  Partially Met   improvements demonstrated in R shoulder abduction AROM  PT LONG TERM GOAL #5   Title  Patient to perform overhead reach with 5lbs with good scapular mechanics.     Time  8    Period  Weeks    Status  Partially Met   08/31/18: able to reach overhead with 5# and mild shoulder hiking           Plan - 08/31/18 0855    Clinical Impression Statement  Pt. reporting ~ 90%  functional improvement since starting therapy.  Able to demo improved R shoulder AROM and strength with MMT today grossly 4/5 overall.  Notes she is still having pain after prolonged use of computer mouse up to 7/10 at end of day thus this goal still ongoing.  Able to lift 5# overhead to 2nd shelf over cabinet today in session without pain however does still demo increased upper trap activation and some shoulder "hiking" substitution.  Pt. wishing to drop therapy down to 1x/wk with supervising PT approving this plan due to work constraints.  Progressing well toward goals.      Clinical Impairments Affecting Rehab Potential   L4-5 MIS TLIF with iliac crest bone graft 07/05/2013; Revision of posterior interbody fusion 07/06/14; Bil laminotomies and foraminotomies L3-L4 with decompression of L3 and L4 nerve roots 01/07/16; Thoracic laminectomies with placement of implanted spinal stimulator 05/25/17; L ankle trimalleolar ankle fracture s/p ORIF on 02/07/18     PT Treatment/Interventions  ADLs/Self Care Home Management;Cryotherapy;Moist Heat;Therapeutic activities;Therapeutic exercise;Neuromuscular re-education;Patient/family education;Passive range of motion;Scar mobilization;Manual techniques;Dry needling;Energy conservation;Splinting;Taping;Vasopneumatic Device    PT Next Visit Plan  FOTO: progress AROM and RTC strengthening, gentle biceps strengthening     Consulted and Agree with Plan of Care  Patient       Patient will benefit from skilled therapeutic intervention in order to improve the following deficits and impairments:  Decreased range of motion, Impaired UE functional use, Increased muscle spasms, Decreased activity tolerance, Pain, Improper body mechanics, Impaired flexibility, Decreased scar mobility, Decreased strength, Increased edema, Postural dysfunction  Visit Diagnosis: Acute pain of right shoulder  Stiffness of right shoulder, not elsewhere classified  Muscle weakness  (generalized)  Other symptoms and signs involving the musculoskeletal system     Problem List Patient Active Problem List   Diagnosis Date Noted  . Chronic right shoulder pain 05/06/2018  . Closed displaced trimalleolar fracture of lower leg with routine healing 02/17/2018  . Lumbar stenosis with neurogenic claudication 01/07/2016  . Lumbar pseudoarthrosis 07/06/2014    Bess Harvest, PTA 08/31/18 5:01 PM   [facility] [address]  [place], [place], [address] Phone: [phone]   Fax:  [phone]  Name: Angela Cook MRN: [id number] Date of Birth: [birth year]  PHYSICAL THERAPY DISCHARGE SUMMARY  Visits from Start of Care: 10  Current functional level related to goals / functional outcomes: See above clinical impression; patient requested to be D/C'd d/t being busy   Remaining deficits: Decreased strength, decreased ROM, decreased functional activity tolerance   Education / Equipment: HEP  Plan: Patient agrees to discharge.  Patient goals were not met. Patient is being discharged due to the patient's request.  ?????     Janene Harvey, PT, DPT 09/12/18 10:43 AM

## 2018-09-01 ENCOUNTER — Encounter: Payer: 59 | Admitting: Physical Therapy

## 2018-09-07 ENCOUNTER — Ambulatory Visit: Payer: 59 | Admitting: Physical Therapy

## 2018-09-28 ENCOUNTER — Encounter: Payer: 59 | Admitting: Physical Therapy

## 2018-10-06 ENCOUNTER — Ambulatory Visit (INDEPENDENT_AMBULATORY_CARE_PROVIDER_SITE_OTHER): Payer: 59 | Admitting: Orthopaedic Surgery

## 2018-10-12 ENCOUNTER — Encounter: Payer: 59 | Admitting: Physical Therapy

## 2020-01-01 ENCOUNTER — Other Ambulatory Visit (HOSPITAL_BASED_OUTPATIENT_CLINIC_OR_DEPARTMENT_OTHER): Payer: Self-pay | Admitting: Family Medicine

## 2020-01-01 DIAGNOSIS — Z1231 Encounter for screening mammogram for malignant neoplasm of breast: Secondary | ICD-10-CM

## 2020-01-05 ENCOUNTER — Ambulatory Visit (HOSPITAL_BASED_OUTPATIENT_CLINIC_OR_DEPARTMENT_OTHER)
Admission: RE | Admit: 2020-01-05 | Discharge: 2020-01-05 | Disposition: A | Discharge status: Home / Self Care | Payer: Medicare HMO | Source: Ambulatory Visit | Attending: Family Medicine | Admitting: Family Medicine

## 2020-01-05 ENCOUNTER — Other Ambulatory Visit: Payer: Self-pay

## 2020-01-05 DIAGNOSIS — Z1231 Encounter for screening mammogram for malignant neoplasm of breast: Secondary | ICD-10-CM | POA: Diagnosis not present

## 2020-07-27 IMAGING — MG DIGITAL SCREENING BILATERAL MAMMOGRAM WITH TOMO AND CAD
8 series · 9 of 24 positions shown · non-contrast
Comparison: Previous exam(s).

CLINICAL DATA: Screening.

EXAM:
DIGITAL SCREENING BILATERAL MAMMOGRAM WITH TOMO AND CAD

[R MLO synth-2D]
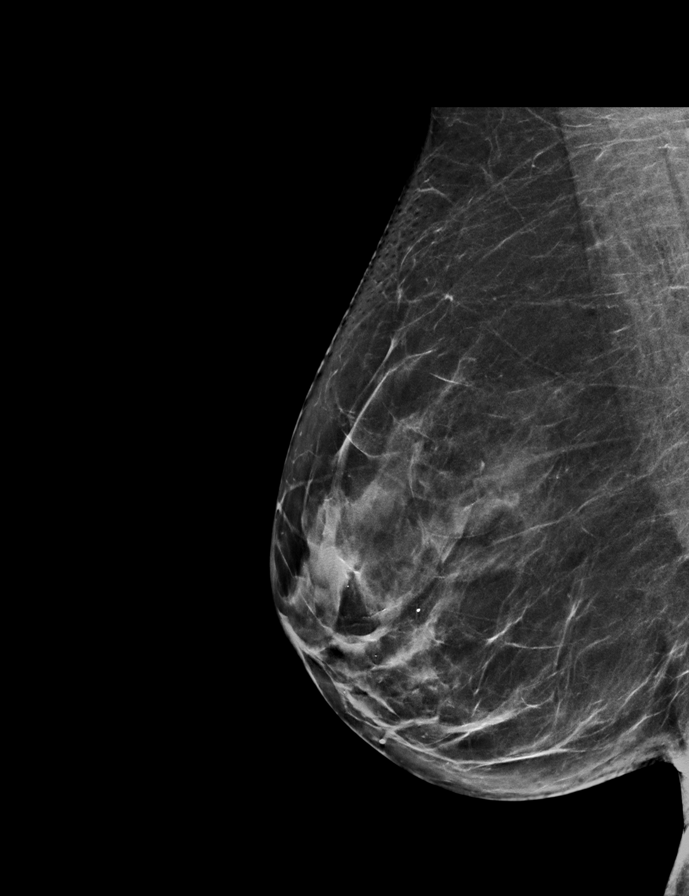

[L CC synth-2D]
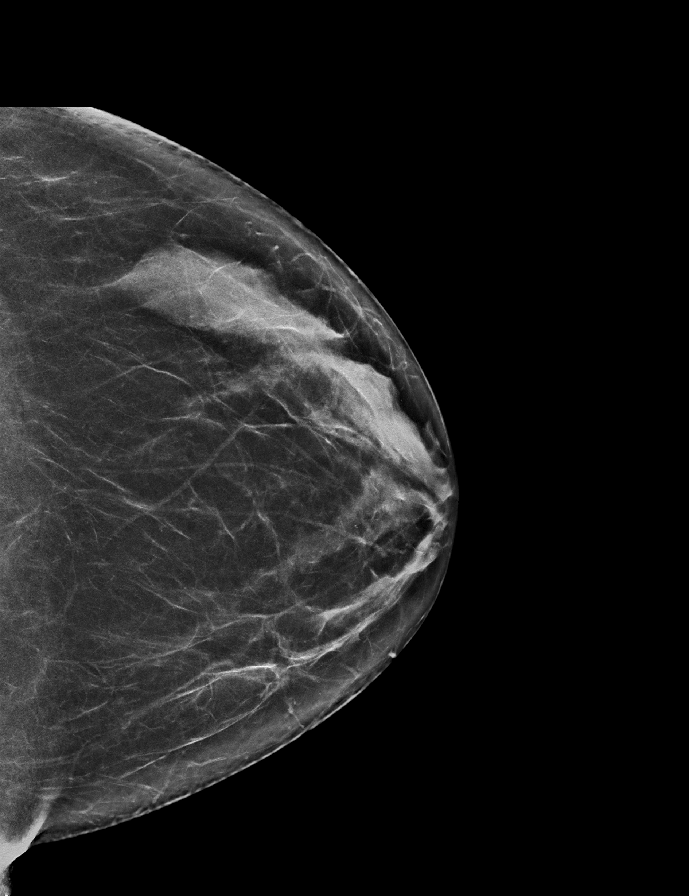

[L MLO synth-2D]
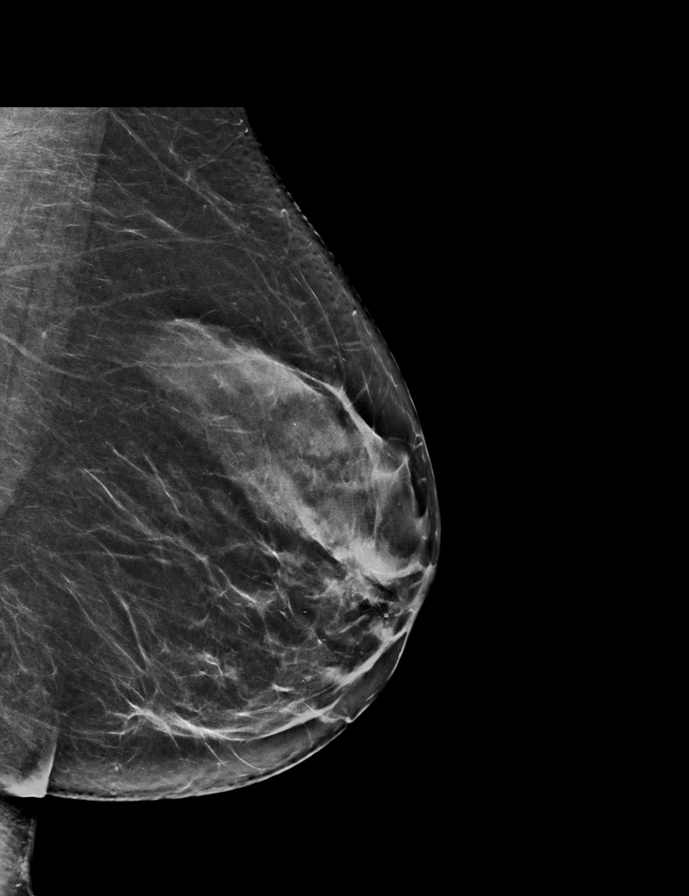

[R CC synth-2D]
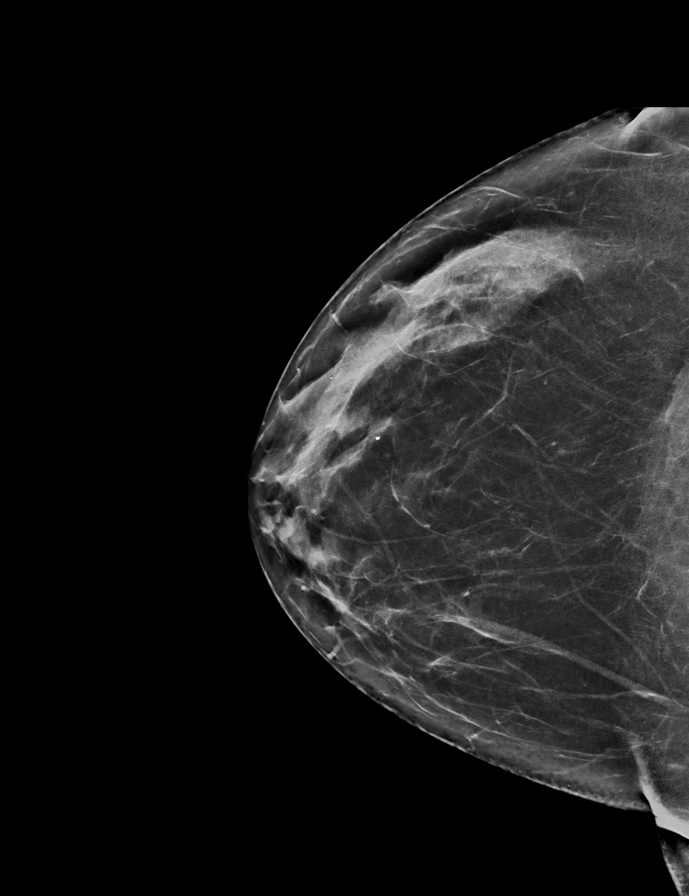

[L CC tomo · 2 of 65 frames shown]
[frame 21/65]
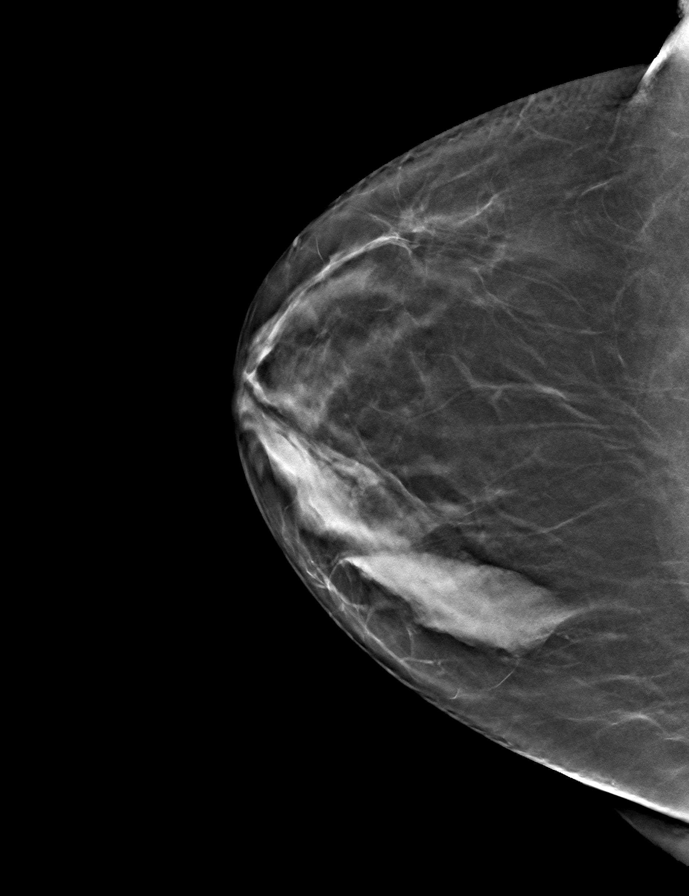
[frame 33/65]
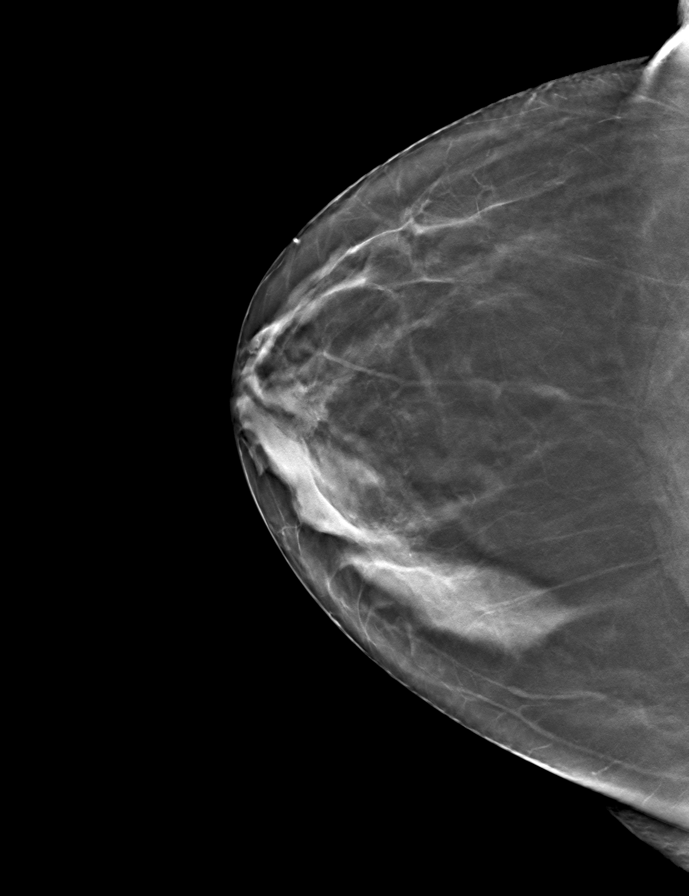

[R MLO tomo · tomo slice 35/69.0]
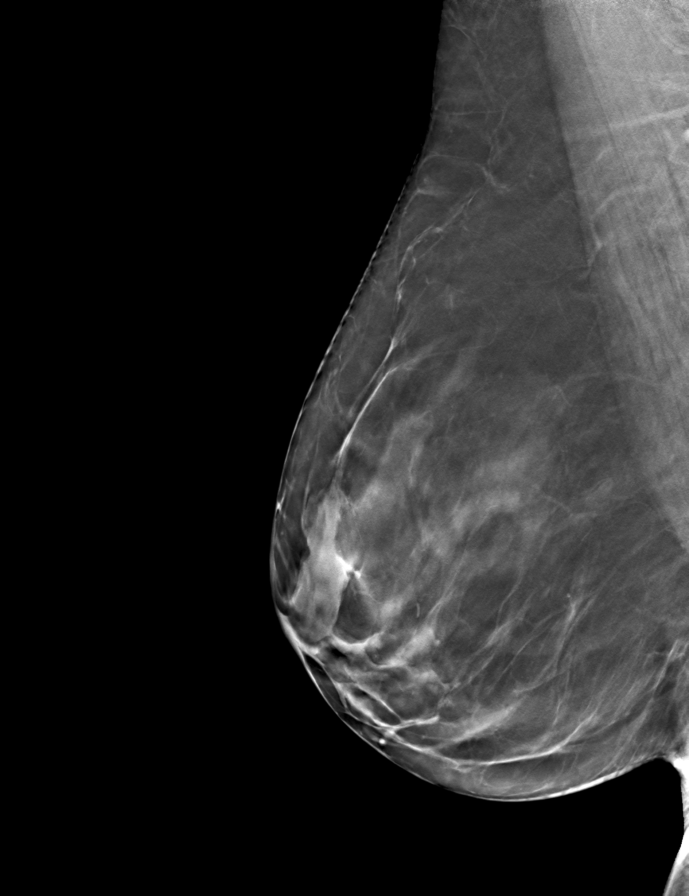

[L MLO tomo · tomo slice 33/65.0]
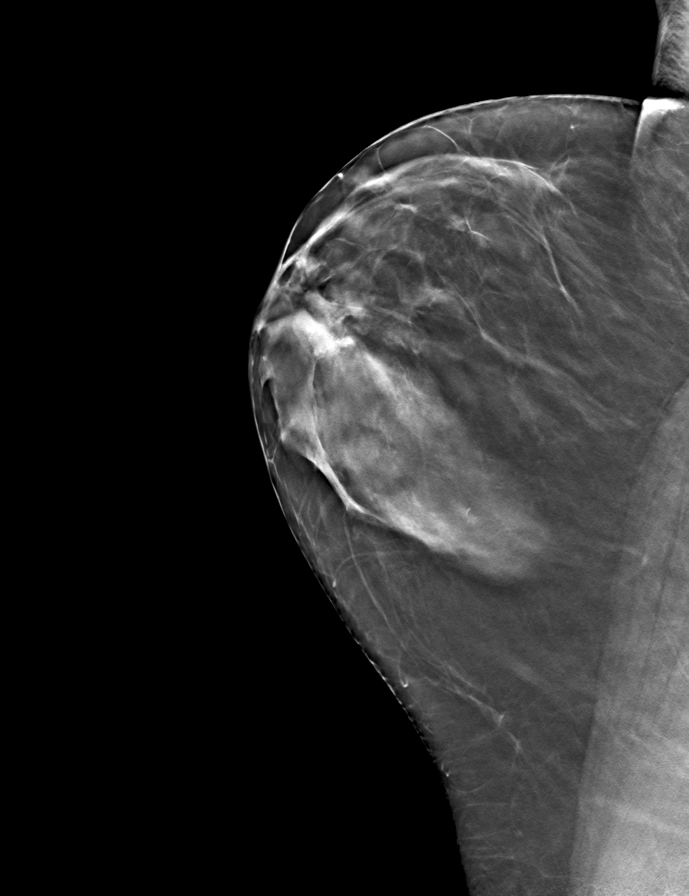

[R CC tomo · tomo slice 35/68.0]
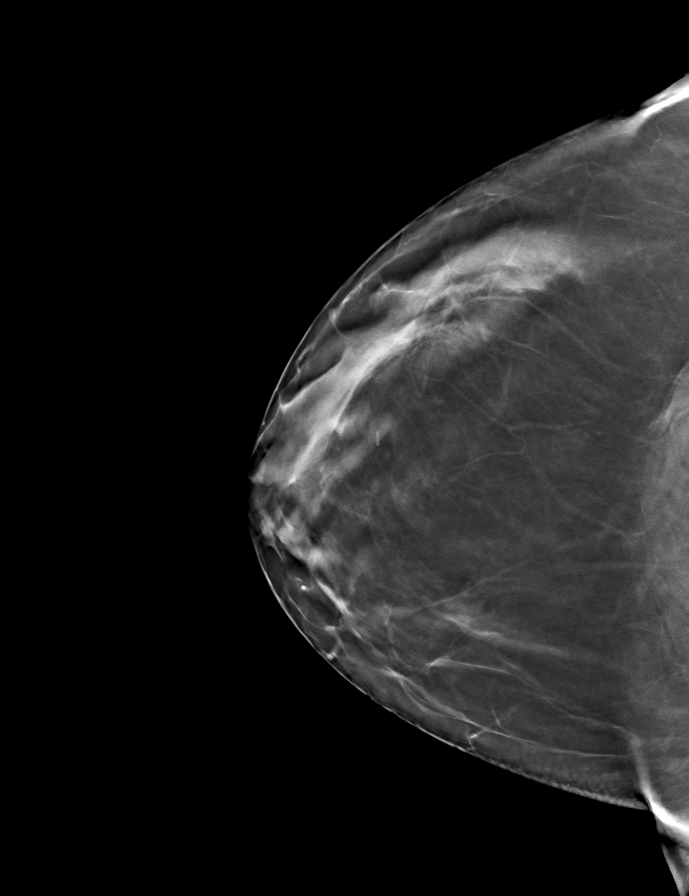

[9 of 24 positions shown; findings below may reference images not displayed]

ACR Breast Density Category b: There are scattered areas of
fibroglandular density.
FINDINGS: There are no findings suspicious for malignancy. Images were
processed with CAD.
IMPRESSION: No mammographic evidence of malignancy. A result letter of this
screening mammogram will be mailed directly to the patient.

RECOMMENDATION:
Screening mammogram in one year. (Code:[id number])

BI-RADS CATEGORY  1: Negative.

## 2021-01-06 ENCOUNTER — Other Ambulatory Visit (HOSPITAL_BASED_OUTPATIENT_CLINIC_OR_DEPARTMENT_OTHER): Payer: Self-pay | Admitting: Emergency Medicine

## 2021-01-06 ENCOUNTER — Other Ambulatory Visit (HOSPITAL_BASED_OUTPATIENT_CLINIC_OR_DEPARTMENT_OTHER): Payer: Self-pay | Admitting: Family Medicine

## 2021-01-06 DIAGNOSIS — Z1231 Encounter for screening mammogram for malignant neoplasm of breast: Secondary | ICD-10-CM

## 2021-02-03 ENCOUNTER — Other Ambulatory Visit: Payer: Self-pay

## 2021-02-03 ENCOUNTER — Ambulatory Visit (HOSPITAL_BASED_OUTPATIENT_CLINIC_OR_DEPARTMENT_OTHER)
Admission: RE | Admit: 2021-02-03 | Discharge: 2021-02-03 | Disposition: A | Discharge status: Home / Self Care | Payer: 59 | Source: Ambulatory Visit | Attending: Family Medicine | Admitting: Family Medicine

## 2021-02-03 ENCOUNTER — Encounter (HOSPITAL_BASED_OUTPATIENT_CLINIC_OR_DEPARTMENT_OTHER): Payer: Self-pay

## 2021-02-03 DIAGNOSIS — Z1231 Encounter for screening mammogram for malignant neoplasm of breast: Secondary | ICD-10-CM | POA: Insufficient documentation

## 2022-01-12 ENCOUNTER — Other Ambulatory Visit (HOSPITAL_BASED_OUTPATIENT_CLINIC_OR_DEPARTMENT_OTHER): Payer: Self-pay | Admitting: Family Medicine

## 2022-01-12 DIAGNOSIS — Z1231 Encounter for screening mammogram for malignant neoplasm of breast: Secondary | ICD-10-CM

## 2022-02-09 ENCOUNTER — Encounter (HOSPITAL_BASED_OUTPATIENT_CLINIC_OR_DEPARTMENT_OTHER): Payer: Self-pay

## 2022-02-09 ENCOUNTER — Ambulatory Visit (HOSPITAL_BASED_OUTPATIENT_CLINIC_OR_DEPARTMENT_OTHER)
Admission: RE | Admit: 2022-02-09 | Discharge: 2022-02-09 | Disposition: A | Discharge status: Home / Self Care | Payer: 59 | Source: Ambulatory Visit | Attending: Family Medicine | Admitting: Family Medicine

## 2022-02-09 DIAGNOSIS — Z1231 Encounter for screening mammogram for malignant neoplasm of breast: Secondary | ICD-10-CM | POA: Insufficient documentation

## 2023-03-23 ENCOUNTER — Other Ambulatory Visit (HOSPITAL_BASED_OUTPATIENT_CLINIC_OR_DEPARTMENT_OTHER): Payer: Self-pay | Admitting: Family Medicine

## 2023-03-23 DIAGNOSIS — Z1231 Encounter for screening mammogram for malignant neoplasm of breast: Secondary | ICD-10-CM

## 2023-04-05 ENCOUNTER — Inpatient Hospital Stay (HOSPITAL_BASED_OUTPATIENT_CLINIC_OR_DEPARTMENT_OTHER): Admission: RE | Admit: 2023-04-05 | Payer: 59 | Source: Ambulatory Visit

## 2023-04-13 ENCOUNTER — Encounter (HOSPITAL_BASED_OUTPATIENT_CLINIC_OR_DEPARTMENT_OTHER): Payer: Self-pay

## 2023-04-13 ENCOUNTER — Ambulatory Visit (HOSPITAL_BASED_OUTPATIENT_CLINIC_OR_DEPARTMENT_OTHER)
Admission: RE | Admit: 2023-04-13 | Discharge: 2023-04-13 | Disposition: A | Discharge status: Home / Self Care | Payer: Medicare HMO | Source: Ambulatory Visit | Attending: Family Medicine | Admitting: Family Medicine

## 2023-04-13 DIAGNOSIS — Z1231 Encounter for screening mammogram for malignant neoplasm of breast: Secondary | ICD-10-CM | POA: Insufficient documentation

## 2024-03-08 ENCOUNTER — Other Ambulatory Visit (HOSPITAL_BASED_OUTPATIENT_CLINIC_OR_DEPARTMENT_OTHER): Payer: Self-pay | Admitting: Family Medicine

## 2024-03-08 DIAGNOSIS — Z1231 Encounter for screening mammogram for malignant neoplasm of breast: Secondary | ICD-10-CM

## 2024-04-20 ENCOUNTER — Encounter (HOSPITAL_BASED_OUTPATIENT_CLINIC_OR_DEPARTMENT_OTHER): Payer: Self-pay

## 2024-04-20 ENCOUNTER — Ambulatory Visit (HOSPITAL_BASED_OUTPATIENT_CLINIC_OR_DEPARTMENT_OTHER)
Admission: RE | Admit: 2024-04-20 | Discharge: 2024-04-20 | Disposition: A | Discharge status: Home / Self Care | Source: Ambulatory Visit | Attending: Family Medicine | Admitting: Family Medicine

## 2024-04-20 DIAGNOSIS — Z1231 Encounter for screening mammogram for malignant neoplasm of breast: Secondary | ICD-10-CM | POA: Insufficient documentation
